# Patient Record
Sex: Male | Born: 1995 | Race: White | Hispanic: No | Marital: Single | Smoking: Never smoker
Health system: Southern US, Community
[De-identification: ages and names within clinical notes are randomized; demographics above are authoritative.]

## PROBLEM LIST (undated history)

## (undated) DIAGNOSIS — T7840XA Allergy, unspecified, initial encounter: Secondary | ICD-10-CM

## (undated) DIAGNOSIS — J45909 Unspecified asthma, uncomplicated: Secondary | ICD-10-CM

## (undated) HISTORY — PX: CLEFT PALATE REPAIR: SUR1165

## (undated) HISTORY — DX: Allergy, unspecified, initial encounter: T78.40XA

## (undated) HISTORY — PX: CYST EXCISION: SHX5701

---
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
## 2013-11-06 ENCOUNTER — Encounter: Payer: Self-pay | Admitting: Family

## 2013-11-06 ENCOUNTER — Encounter (INDEPENDENT_AMBULATORY_CARE_PROVIDER_SITE_OTHER): Payer: Self-pay

## 2013-11-06 ENCOUNTER — Ambulatory Visit (INDEPENDENT_AMBULATORY_CARE_PROVIDER_SITE_OTHER): Payer: BC Managed Care – PPO | Admitting: Family

## 2013-11-06 VITALS — BP 126/90 | HR 87 | Ht 70.0 in | Wt 234.0 lb

## 2013-11-06 DIAGNOSIS — J309 Allergic rhinitis, unspecified: Secondary | ICD-10-CM

## 2013-11-06 DIAGNOSIS — J4530 Mild persistent asthma, uncomplicated: Secondary | ICD-10-CM | POA: Insufficient documentation

## 2013-11-06 DIAGNOSIS — J45909 Unspecified asthma, uncomplicated: Secondary | ICD-10-CM

## 2013-11-06 DIAGNOSIS — F988 Other specified behavioral and emotional disorders with onset usually occurring in childhood and adolescence: Secondary | ICD-10-CM | POA: Insufficient documentation

## 2013-11-06 DIAGNOSIS — J4540 Moderate persistent asthma, uncomplicated: Secondary | ICD-10-CM | POA: Insufficient documentation

## 2013-11-06 MED ORDER — FLUTICASONE PROPIONATE HFA 110 MCG/ACT IN AERO: 1.0000 | INHALATION_SPRAY | Freq: Two times a day (BID) | RESPIRATORY_TRACT | Status: DC

## 2013-11-06 MED ORDER — ALBUTEROL SULFATE HFA 108 (90 BASE) MCG/ACT IN AERS: 2.0000 | INHALATION_SPRAY | Freq: Four times a day (QID) | RESPIRATORY_TRACT | Status: DC | PRN

## 2013-11-06 MED ORDER — AMPHETAMINE-DEXTROAMPHET ER 20 MG PO CP24: 20.0000 mg | ORAL_CAPSULE | ORAL | Status: DC

## 2013-11-06 MED ORDER — FEXOFENADINE HCL 180 MG PO TABS: 180.0000 mg | ORAL_TABLET | Freq: Every day | ORAL | Status: DC

## 2013-11-06 NOTE — Patient Instructions (Signed)

## 2013-11-06 NOTE — Progress Notes (Signed)
Pre visit review using our clinic review tool, if applicable. No additional management support is needed unless otherwise documented below in the visit note. 

## 2013-11-06 NOTE — Progress Notes (Signed)
   Subjective:    Patient ID: Eric Gould, male    DOB: 1996/02/01, 17 y.o.   MRN: [id number]  HPI 17 year old white male, college student at [employer], is in to be established. He has a history of asthma, allergic rhinitis, and attention deficit disorder. He currently takes Proventil, Psychologist, forensic. In the past, he's been on Adderall for attention deficit disorder. He has currently been off the medication approximately one year. Patient reports that he discontinue the medication because it was causing decreased appetite and he needed to gain weight for wrestling. He is no longer wrestling this found a 7 difficulty concentrating and college. He drop one class this semester. He has a long-standing history of attention deficit disorder dating back to his childhood.   Review of Systems  Constitutional: Negative.   HENT: Negative.   Respiratory: Negative.   Cardiovascular: Negative.   Gastrointestinal: Negative.   Endocrine: Negative.   Genitourinary: Negative.   Musculoskeletal: Negative.   Skin: Negative.   Neurological: Negative.   Hematological: Negative.   Psychiatric/Behavioral: Negative.    Past Medical History  Diagnosis Date  . Asthma     History   Social History  . Marital Status: Single    Spouse Name: N/A    Number of Children: N/A  . Years of Education: N/A   Occupational History  . Not on file.   Social History Main Topics  . Smoking status: Never Smoker   . Smokeless tobacco: Not on file  . Alcohol Use: No  . Drug Use: No  . Sexual Activity:    Other Topics Concern  . Not on file   Social History Narrative  . No narrative on file    Past Surgical History  Procedure Laterality Date  . Cleft palate repair      Family History  Problem Relation Age of Onset  . Asthma Mother     No Known Allergies  No current outpatient prescriptions on file prior to visit.   No current facility-administered medications on file prior to visit.    BP  126/90  Pulse 87  Ht 5\' 10"  (1.778 m)  Wt 234 lb (106.142 kg)  BMI 33.58 kg/m2chart    Objective:   Physical Exam  Constitutional: He is oriented to person, place, and time. He appears well-developed and well-nourished.  HENT:  Right Ear: External ear normal.  Left Ear: External ear normal.  Nose: Nose normal.  Mouth/Throat: Oropharynx is clear and moist.  Neck: Normal range of motion. Neck supple.  Cardiovascular: Normal rate, regular rhythm and normal heart sounds.   Pulmonary/Chest: Effort normal and breath sounds normal.  Musculoskeletal: Normal range of motion.  Neurological: He is alert and oriented to person, place, and time.  Skin: Skin is warm and dry.  Psychiatric: He has a normal mood and affect.          Assessment & Plan:  Assessment: 1. Allergic rhinitis 2. Asthma 3. Attention deficit disorder  Plan: Continue current medications. Recheck in 3 weeks on Adderall XR 20 mg once daily. Probably office with any questions or concerns.

## 2013-11-27 ENCOUNTER — Ambulatory Visit: Payer: BC Managed Care – PPO | Admitting: Family

## 2013-11-29 ENCOUNTER — Encounter: Payer: Self-pay | Admitting: Family

## 2013-11-29 ENCOUNTER — Ambulatory Visit (INDEPENDENT_AMBULATORY_CARE_PROVIDER_SITE_OTHER): Payer: BC Managed Care – PPO | Admitting: Family

## 2013-11-29 VITALS — BP 120/80 | HR 81 | Wt 232.0 lb

## 2013-11-29 DIAGNOSIS — F988 Other specified behavioral and emotional disorders with onset usually occurring in childhood and adolescence: Secondary | ICD-10-CM

## 2013-11-29 MED ORDER — AMPHETAMINE-DEXTROAMPHET ER 20 MG PO CP24: 20.0000 mg | ORAL_CAPSULE | ORAL | Status: DC

## 2013-11-29 MED ORDER — BECLOMETHASONE DIPROPIONATE 40 MCG/ACT IN AERS: 2.0000 | INHALATION_SPRAY | Freq: Two times a day (BID) | RESPIRATORY_TRACT | Status: DC

## 2013-11-29 NOTE — Progress Notes (Signed)
   Subjective:    Patient ID: Eric Gould, male    DOB: 05/15/1996, 18 y.o.   MRN: [id number]  HPI 18 year old white male, nonsmoker is in for recheck of attention deficit disorder. He started the Adderall 20 mg once daily and is tolerating the medication well. Reports sleeping well. Appetite is decreased around lunchtime but he still manages the healthy diet. Concentration has improved significantly.   Review of Systems  Constitutional: Negative.   Respiratory: Negative.   Cardiovascular: Negative.   Gastrointestinal: Negative.   Neurological: Negative.   Psychiatric/Behavioral: Negative.    Past Medical History  Diagnosis Date  . Asthma     History   Social History  . Marital Status: Single    Spouse Name: N/A    Number of Children: N/A  . Years of Education: N/A   Occupational History  . Not on file.   Social History Main Topics  . Smoking status: Never Smoker   . Smokeless tobacco: Not on file  . Alcohol Use: No  . Drug Use: No  . Sexual Activity:    Other Topics Concern  . Not on file   Social History Narrative  . No narrative on file    Past Surgical History  Procedure Laterality Date  . Cleft palate repair      Family History  Problem Relation Age of Onset  . Asthma Mother     No Known Allergies  Current Outpatient Prescriptions on File Prior to Visit  Medication Sig Dispense Refill  . albuterol (PROVENTIL HFA;VENTOLIN HFA) 108 (90 BASE) MCG/ACT inhaler Inhale 2 puffs into the lungs every 6 (six) hours as needed. For shortness of breath  3.7 Inhaler  3  . fexofenadine (ALLEGRA) 180 MG tablet Take 1 tablet (180 mg total) by mouth daily.  90 tablet  1  . ofloxacin (OCUFLOX) 0.3 % ophthalmic solution Place 1 drop into both eyes as needed.        No current facility-administered medications on file prior to visit.    BP 120/80  Pulse 81  Wt 232 lb (105.235 kg)chart    Objective:   Physical Exam  Constitutional: He appears well-developed and  well-nourished.  Neck: Normal range of motion. Neck supple.  Cardiovascular: Normal rate, regular rhythm and normal heart sounds.   Pulmonary/Chest: Effort normal and breath sounds normal.  Musculoskeletal: Normal range of motion.  Neurological: He is alert.  Skin: Skin is warm.  Psychiatric: He has a normal mood and affect.          Assessment & Plan:  Assessment: 1 Attention deficit disorder  Plan: Urine drug screen sent today. Prescription for Adderall given x3 months. Recheck in 3 months and sooner as needed..Marland Kitchen

## 2013-11-29 NOTE — Patient Instructions (Signed)
Attention Deficit Hyperactivity Disorder Attention deficit hyperactivity disorder (ADHD) is a problem with behavior issues based on the way the brain functions (neurobehavioral disorder). It is a common reason for behavior and academic problems in school. CAUSES  The cause of ADHD is unknown in most cases. It may run in families. It sometimes can be associated with learning disabilities and other behavioral problems. SYMPTOMS  There are 3 types of ADHD. The 3 types and some of the symptoms include:  Inattentive  Gets bored or distracted easily.  Loses or forgets things. Forgets to hand in homework.  Has trouble organizing or completing tasks.  Difficulty staying on task.  An inability to organize daily tasks and school work.  Leaving projects, chores, or homework unfinished.  Trouble paying attention or responding to details. Careless mistakes.  Difficulty following directions. Often seems like is not listening.  Dislikes activities that require sustained attention (like chores or homework).  Hyperactive-impulsive  Feels like it is impossible to sit still or stay in a seat. Fidgeting with hands and feet.  Trouble waiting turn.  Talking too much or out of turn. Interruptive.  Speaks or acts impulsively.  Aggressive, disruptive behavior.  Constantly busy or on the go, noisy.  Combined  Has symptoms of both of the above. Often children with ADHD feel discouraged about themselves and with school. They often perform well below their abilities in school. These symptoms can cause problems in home, school, and in relationships with peers. As children get older, the excess motor activities can calm down, but the problems with paying attention and staying organized persist. Most children do not outgrow ADHD but with good treatment can learn to cope with the symptoms. DIAGNOSIS  When ADHD is suspected, the diagnosis should be made by professionals trained in ADHD.  Diagnosis will  include:  Ruling out other reasons for the child's behavior.  The caregivers will check with the child's school and check their medical records.  They will talk to teachers and parents.  Behavior rating scales for the child will be filled out by those dealing with the child on a daily basis. A diagnosis is made only after all information has been considered. TREATMENT  Treatment usually includes behavioral treatment often along with medicines. It may include stimulant medicines. The stimulant medicines decrease impulsivity and hyperactivity and increase attention. Other medicines used include antidepressants and certain blood pressure medicines. Most experts agree that treatment for ADHD should address all aspects of the child's functioning. Treatment should not be limited to the use of medicines alone. Treatment should include structured classroom management. The parents must receive education to address rewarding good behavior, discipline, and limit-setting. Tutoring or behavioral therapy or both should be available for the child. If untreated, the disorder can have long-term serious effects into adolescence and adulthood. HOME CARE INSTRUCTIONS   Often with ADHD there is a lot of frustration among the family in dealing with the illness. There is often blame and anger that is not warranted. This is a life long illness. There is no way to prevent ADHD. In many cases, because the problem affects the family as a whole, the entire family may need help. A therapist can help the family find better ways to handle the disruptive behaviors and promote change. If the child is young, most of the therapist's work is with the parents. Parents will learn techniques for coping with and improving their child's behavior. Sometimes only the child with the ADHD needs counseling. Your caregivers can help   you make these decisions.  Children with ADHD may need help in organizing. Some helpful tips include:  Keep  routines the same every day from wake-up time to bedtime. Schedule everything. This includes homework and playtime. This should include outdoor and indoor recreation. Keep the schedule on the refrigerator or a bulletin board where it is frequently seen. Mark schedule changes as far in advance as possible.  Have a place for everything and keep everything in its place. This includes clothing, backpacks, and school supplies.  Encourage writing down assignments and bringing home needed books.  Offer your child a well-balanced diet. Breakfast is especially important for school performance. Children should avoid drinks with caffeine including:  Soft drinks.  Coffee.  Tea.  However, some older children (adolescents) may find these drinks helpful in improving their attention.  Children with ADHD need consistent rules that they can understand and follow. If rules are followed, give small rewards. Children with ADHD often receive, and expect, criticism. Look for good behavior and praise it. Set realistic goals. Give clear instructions. Look for activities that can foster success and self-esteem. Make time for pleasant activities with your child. Give lots of affection.  Parents are their children's greatest advocates. Learn as much as possible about ADHD. This helps you become a stronger and better advocate for your child. It also helps you educate your child's teachers and instructors if they feel inadequate in these areas. Parent support groups are often helpful. A national group with local chapters is called CHADD (Children and Adults with Attention Deficit Hyperactivity Disorder). PROGNOSIS  There is no cure for ADHD. Children with the disorder seldom outgrow it. Many find adaptive ways to accommodate the ADHD as they mature. SEEK MEDICAL CARE IF:  Your child has repeated muscle twitches, cough or speech outbursts.  Your child has sleep problems.  Your child has a marked loss of  appetite.  Your child develops depression.  Your child has new or worsening behavioral problems.  Your child develops dizziness.  Your child has a racing heart.  Your child has stomach pains.  Your child develops headaches. Document Released: 10/29/2002 Document Revised: 01/31/2012 Document Reviewed: 05/30/2013 ExitCare Patient Information 2014 [employer].  

## 2013-12-26 ENCOUNTER — Encounter: Payer: Self-pay | Admitting: Family

## 2014-05-14 ENCOUNTER — Ambulatory Visit: Payer: BC Managed Care – PPO | Admitting: Family

## 2014-06-24 ENCOUNTER — Telehealth: Payer: Self-pay | Admitting: Family

## 2014-06-24 NOTE — Telephone Encounter (Signed)
Pt needs a re-fills on the following: amphetamine-dextroamphetamine (ADDERALL XR) 20 MG 24 hr capsule albuterol (PROVENTIL HFA;VENTOLIN HFA) 108 (90 BASE) MCG/ACT inhaler beclomethasone (QVAR) 40 MCG/ACT inhaler fexofenadine (ALLEGRA) 180 MG tablet  [id number] - [place], [place] - [address]

## 2014-06-24 NOTE — Telephone Encounter (Signed)
Pt needs follow up

## 2014-06-24 NOTE — Telephone Encounter (Signed)
Pt is scheduled for 06/26/14

## 2014-06-26 ENCOUNTER — Encounter: Payer: Self-pay | Admitting: Family

## 2014-06-26 ENCOUNTER — Ambulatory Visit (INDEPENDENT_AMBULATORY_CARE_PROVIDER_SITE_OTHER): Payer: BC Managed Care – PPO | Admitting: Family

## 2014-06-26 VITALS — BP 120/80 | HR 89 | Wt 230.0 lb

## 2014-06-26 DIAGNOSIS — J309 Allergic rhinitis, unspecified: Secondary | ICD-10-CM

## 2014-06-26 DIAGNOSIS — J4520 Mild intermittent asthma, uncomplicated: Secondary | ICD-10-CM

## 2014-06-26 DIAGNOSIS — F988 Other specified behavioral and emotional disorders with onset usually occurring in childhood and adolescence: Secondary | ICD-10-CM

## 2014-06-26 DIAGNOSIS — J45909 Unspecified asthma, uncomplicated: Secondary | ICD-10-CM

## 2014-06-26 MED ORDER — BECLOMETHASONE DIPROPIONATE 40 MCG/ACT IN AERS: 2.0000 | INHALATION_SPRAY | Freq: Two times a day (BID) | RESPIRATORY_TRACT | Status: DC

## 2014-06-26 MED ORDER — AMPHETAMINE-DEXTROAMPHET ER 20 MG PO CP24: 20.0000 mg | ORAL_CAPSULE | ORAL | Status: DC

## 2014-06-26 MED ORDER — ALBUTEROL SULFATE HFA 108 (90 BASE) MCG/ACT IN AERS: 2.0000 | INHALATION_SPRAY | Freq: Four times a day (QID) | RESPIRATORY_TRACT | Status: DC | PRN

## 2014-06-26 MED ORDER — FEXOFENADINE HCL 180 MG PO TABS: 180.0000 mg | ORAL_TABLET | Freq: Every day | ORAL | Status: DC

## 2014-06-26 NOTE — Progress Notes (Signed)
Subjective:    Patient ID: Eric Gould, male    DOB: 10-31-1996, 18 y.o.   MRN: [id number]  HPI  18 year old male, nonsmoker, college student at [employer] he is in today for refill of Adderall for attention deficit disorder. Is currently stable on medications without any concerns. Also has intrinsic asthma. The refill on Proventil. Has allergic rhinitis and takes Allegra.  Review of Systems  Constitutional: Negative.   HENT: Negative.   Respiratory: Negative.  Negative for cough and wheezing.   Cardiovascular: Negative.   Gastrointestinal: Negative.   Genitourinary: Negative.   Musculoskeletal: Negative.   Skin: Negative.   Allergic/Immunologic: Negative.   Neurological: Negative.   Hematological: Negative.   Psychiatric/Behavioral: Negative.      Past Medical History  Diagnosis Date  . Asthma     History   Social History  . Marital Status: Single    Spouse Name: N/A    Number of Children: N/A  . Years of Education: N/A   Occupational History  . Not on file.   Social History Main Topics  . Smoking status: Never Smoker   . Smokeless tobacco: Not on file  . Alcohol Use: No  . Drug Use: No  . Sexual Activity:    Other Topics Concern  . Not on file   Social History Narrative  . No narrative on file    Past Surgical History  Procedure Laterality Date  . Cleft palate repair      Family History  Problem Relation Age of Onset  . Asthma Mother     No Known Allergies  Current Outpatient Prescriptions on File Prior to Visit  Medication Sig Dispense Refill  . ofloxacin (OCUFLOX) 0.3 % ophthalmic solution Place 1 drop into both eyes as needed.        No current facility-administered medications on file prior to visit.    BP 120/80  Pulse 89  Wt 230 lb (104.327 kg)chart    Objective:   Physical Exam  Constitutional: He is oriented to person, place, and time. He appears well-developed and well-nourished.  HENT:  Right Ear:  External ear normal.  Left Ear: External ear normal.  Nose: Nose normal.  Mouth/Throat: Oropharynx is clear and moist.  Neck: Normal range of motion. Neck supple.  Cardiovascular: Normal rate, regular rhythm and normal heart sounds.   Pulmonary/Chest: Effort normal.  Musculoskeletal: Normal range of motion.  Neurological: He is alert and oriented to person, place, and time.  Skin: Skin is warm and dry.  Psychiatric: He has a normal mood and affect.          Assessment & Plan:  Raeford was seen today for medication refill.  Diagnoses and associated orders for this visit:  ADD (attention deficit disorder)  Allergic rhinitis, unspecified allergic rhinitis type  Intrinsic asthma, mild intermittent, uncomplicated  Other Orders - fexofenadine (ALLEGRA) 180 MG tablet; Take 1 tablet (180 mg total) by mouth daily. - beclomethasone (QVAR) 40 MCG/ACT inhaler; Inhale 2 puffs into the lungs 2 (two) times daily. - amphetamine-dextroamphetamine (ADDERALL XR) 20 MG 24 hr capsule; Take 1 capsule (20 mg total) by mouth every morning. - amphetamine-dextroamphetamine (ADDERALL XR) 20 MG 24 hr capsule; Take 1 capsule (20 mg total) by mouth every morning. - albuterol (PROVENTIL HFA;VENTOLIN HFA) 108 (90 BASE) MCG/ACT inhaler; Inhale 2 puffs into the lungs every 6 (six) hours as needed. For shortness of breath - amphetamine-dextroamphetamine (ADDERALL XR) 20 MG 24 hr capsule; Take 1 capsule (20 mg  total) by mouth every morning.    call the office if any questions or concerns. Recheck as scheduled in 3 months and sooner as needed.

## 2014-06-26 NOTE — Patient Instructions (Signed)
Exercise to Stay Healthy Exercise helps you become and stay healthy. EXERCISE IDEAS AND TIPS Choose exercises that:  You enjoy.  Fit into your day. You do not need to exercise really hard to be healthy. You can do exercises at a slow or medium level and stay healthy. You can:  Stretch before and after working out.  Try yoga, Pilates, or tai chi.  Lift weights.  Walk fast, swim, jog, run, climb stairs, bicycle, dance, or rollerskate.  Take aerobic classes. Exercises that burn about 150 calories:  Running 1  miles in 15 minutes.  Playing volleyball for 45 to 60 minutes.  Washing and waxing a car for 45 to 60 minutes.  Playing touch football for 45 minutes.  Walking 1  miles in 35 minutes.  Pushing a stroller 1  miles in 30 minutes.  Playing basketball for 30 minutes.  Raking leaves for 30 minutes.  Bicycling 5 miles in 30 minutes.  Walking 2 miles in 30 minutes.  Dancing for 30 minutes.  Shoveling snow for 15 minutes.  Swimming laps for 20 minutes.  Walking up stairs for 15 minutes.  Bicycling 4 miles in 15 minutes.  Gardening for 30 to 45 minutes.  Jumping rope for 15 minutes.  Washing windows or floors for 45 to 60 minutes. Document Released: 12/11/2010 Document Revised: 01/31/2012 Document Reviewed: 12/11/2010 ExitCare Patient Information 2015 [employer]. This information is not intended to replace advice given to you by your health care provider. Make sure you discuss any questions you have with your health care provider.  

## 2014-09-03 ENCOUNTER — Telehealth: Payer: Self-pay | Admitting: Family

## 2014-09-03 NOTE — Telephone Encounter (Signed)
Mom called to ask for a letter stating that pt has asthma. They need this letter so that pt can get a handicap pass at school.

## 2014-09-04 ENCOUNTER — Telehealth: Payer: Self-pay | Admitting: Family

## 2014-09-04 MED ORDER — ALBUTEROL SULFATE (2.5 MG/3ML) 0.083% IN NEBU: 2.5000 mg | INHALATION_SOLUTION | Freq: Four times a day (QID) | RESPIRATORY_TRACT | Status: DC | PRN

## 2014-09-04 NOTE — Telephone Encounter (Signed)
Pt mom is aware note ready

## 2014-09-04 NOTE — Telephone Encounter (Signed)
Ok to give a note.

## 2014-09-04 NOTE — Telephone Encounter (Signed)
[id number] - [place], [place] - [address] is requesting re-fill on ALBUTEROL 0.083% NEB SOLUTION. *comment states RX was originally written by pediatrician*

## 2014-09-04 NOTE — Telephone Encounter (Signed)
Please advise 

## 2014-09-04 NOTE — Telephone Encounter (Signed)
Note ready for pick up 

## 2014-09-04 NOTE — Telephone Encounter (Signed)
Done

## 2014-09-05 ENCOUNTER — Ambulatory Visit: Payer: BC Managed Care – PPO | Admitting: Internal Medicine

## 2014-09-10 ENCOUNTER — Telehealth: Payer: Self-pay | Admitting: Family

## 2014-09-10 NOTE — Telephone Encounter (Signed)
Mom would like a cb today in regards to the handicap sticker.  Mom would like padonda to call. They do not have a handicap sticker just for the school campus. Thanks.

## 2014-09-10 NOTE — Telephone Encounter (Signed)
Pt's mom is upset with Padonda's decision not sign DMV handicap placard. She states that she doesn't understand why it won't be signed considering that a note was sent to her stating that he needs a handicap pass. I advised pt's mom that the note sent to me stated that pt needs note for a parking pass for school not a handicap placard [facility].   Pt's mom is requesting to speak to [place] and Padonda agreed.

## 2014-12-21 ENCOUNTER — Other Ambulatory Visit: Payer: Self-pay | Admitting: Family

## 2015-02-18 ENCOUNTER — Other Ambulatory Visit: Payer: Self-pay | Admitting: Family

## 2015-03-05 ENCOUNTER — Telehealth: Payer: Self-pay | Admitting: Family

## 2015-03-05 NOTE — Telephone Encounter (Signed)
padonda had written pt a rx for amphetamine-dextroamphetamine (ADDERALL XR) 20 MG 24 hr capsule  . Mom went to take rx to get filled, pt is away at college. RX is over 206 months old. Pharm would not fill. Mom states pt does not take every day. Mom would like to get this rx exchanged for a new one so that pt can get through exams.

## 2015-03-05 NOTE — Telephone Encounter (Signed)
Denied.  Pt needs appointment. 

## 2015-03-06 ENCOUNTER — Other Ambulatory Visit: Payer: Self-pay

## 2015-03-06 MED ORDER — AMPHETAMINE-DEXTROAMPHET ER 20 MG PO CP24: 20.0000 mg | ORAL_CAPSULE | ORAL | Status: DC

## 2015-03-06 MED ORDER — AMPHETAMINE-DEXTROAMPHET ER 20 MG PO CP24
ORAL_CAPSULE | ORAL | Status: DC
Start: 2015-03-06 — End: 2015-04-07

## 2015-03-06 NOTE — Telephone Encounter (Signed)
You can give a 1x fill under circumstances if we can verify with pharmacy. Further refills would need an appointment.

## 2015-03-06 NOTE — Telephone Encounter (Signed)
Refilled for 1 month, notify pt mom that Rx will be upfront.

## 2015-03-06 NOTE — Telephone Encounter (Signed)
Pt needs rx for his adderall Pam Grizzle/(mom) states the amphetamine-dextroamphetamine (ADDERALL XR) 20 MG 24 hr capsule rx is not a new rx, is has expired b/c pt only takes as needed. Mom took took to pharm and rx was 86 mos old and but pharm would not fill because of expiration. Pt is having exams now and mom was going to drive the rx to [place].  Mom has the old rx, would like to exchange for an updated. Pt will be home form school Friday,  May 13, and mom says she can bring pt in that day if you want. Pt has appt 6/24 to est w/ you but will absolutley bring pt in sooner, whenever you want. But [place] is in [place], needs his rx for exams and hoping you can help.

## 2015-03-06 NOTE — Telephone Encounter (Signed)
Mom will bring me old rx and I will bring to you when she arrives. Thanks!

## 2015-03-06 NOTE — Telephone Encounter (Signed)
See below

## 2015-04-07 ENCOUNTER — Encounter: Payer: Self-pay | Admitting: Family Medicine

## 2015-04-07 ENCOUNTER — Ambulatory Visit (INDEPENDENT_AMBULATORY_CARE_PROVIDER_SITE_OTHER): Payer: BLUE CROSS/BLUE SHIELD | Admitting: Family Medicine

## 2015-04-07 VITALS — BP 110/70 | HR 85 | Temp 98.1°F | Wt 226.0 lb

## 2015-04-07 DIAGNOSIS — J4540 Moderate persistent asthma, uncomplicated: Secondary | ICD-10-CM

## 2015-04-07 DIAGNOSIS — F909 Attention-deficit hyperactivity disorder, unspecified type: Secondary | ICD-10-CM | POA: Diagnosis not present

## 2015-04-07 DIAGNOSIS — F988 Other specified behavioral and emotional disorders with onset usually occurring in childhood and adolescence: Secondary | ICD-10-CM

## 2015-04-07 MED ORDER — AMPHETAMINE-DEXTROAMPHET ER 20 MG PO CP24: 20.0000 mg | ORAL_CAPSULE | ORAL | Status: DC

## 2015-04-07 NOTE — Patient Instructions (Signed)
Things look great, see you back in June for your physical. Hope you figure out where you want to go in life.

## 2015-04-07 NOTE — Progress Notes (Signed)
Tana ConchStephen Zael Shuman, MD  Subjective:  Eric Gould is a 19 y.o. year old very pleasant male patient who presents with:  See problem oriented charting- ROS- no palpitations, chest pain, blood pressure issues. Occasional shortness of breath and wheeze relieved by albuterol  Past Medical History- allergic rhinitis  Medications- reviewed and updated Current Outpatient Prescriptions  Medication Sig Dispense Refill  . amphetamine-dextroamphetamine (ADDERALL XR) 20 MG 24 hr capsule Take 1 capsule (20 mg total) by mouth every morning. 30 capsule 0  . beclomethasone (QVAR) 40 MCG/ACT inhaler Inhale 2 puffs into the lungs 2 (two) times daily. 1 Inhaler 3  . fexofenadine (ALLEGRA) 180 MG tablet TAKE 1 TABLET BY MOUTH EVERY DAY 30 tablet 0  . albuterol (PROVENTIL) (2.5 MG/3ML) 0.083% nebulizer solution Take 3 mLs (2.5 mg total) by nebulization every 6 (six) hours as needed for wheezing or shortness of breath. (Patient not taking: Reported on 04/07/2015) 75 mL 3  . PROAIR HFA 108 (90 BASE) MCG/ACT inhaler INHALE 2 PUFFS INTO THE LUNGS EVERY 6 (SIX) HOURS AS NEEDED. FOR SHORTNESS OF BREATH (Patient not taking: Reported on 04/07/2015) 8.5 Inhaler 1   Objective: BP 110/70 mmHg  Pulse 85  Temp(Src) 98.1 F (36.7 C)  Wt 226 lb (102.513 kg) Gen: NAD, resting comfortably, muscular  CV: RRR no murmurs rubs or gallops Lungs: CTAB no crackles, wheeze, rhonchi Abdomen: soft/nontender/nondistended/normal bowel sounds.  Ext: no edema Skin: warm, dry, no rash  Neuro: grossly normal, moves all extremities   Assessment/Plan:  ADD (attention deficit disorder) S: Diagnosed 2nd or 3rd grade. Diagnosed by [name] of pediatrics. Started medication in 6th grade. Had weight gain issues (couldnt eat enough for sports) and stopped for a while in high school. Restarted when went to colleges. Took concerta in the past- no clear issues with this but has tolerated adderall 20mg  XR well without side effects (previously on  higher dose). Sometimes doesn't take medication on weekends.  A/P: doing well, 3 months rx provided adderall XR 20mg  with call in 3 months, visit in 6 months.     Asthma, moderate persistent, well-controlled S: controlled with Qvar twice a day,albuterol once every 2 weeks.  A/P: controlled, continue current rx    Return for CPE before end of year   Meds ordered this encounter  Medications  .  amphetamine-dextroamphetamine (ADDERALL XR) 20 MG 24 hr capsule    Sig: Take 1 capsule (20 mg total) by mouth every morning.    Dispense:  30 capsule    Refill:  0    May fill 04/07/15  . amphetamine-dextroamphetamine (ADDERALL XR) 20 MG 24 hr capsule    Sig: Take 1 capsule (20 mg total) by mouth every morning.    Dispense:  30 capsule    Refill:  0    May fill 05/08/15  . amphetamine-dextroamphetamine (ADDERALL XR) 20 MG 24 hr capsule    Sig: Take 1 capsule (20 mg total) by mouth every morning.    Dispense:  30 capsule    Refill:  0    May fill 06/07/15

## 2015-04-07 NOTE — Assessment & Plan Note (Signed)
S: Diagnosed 2nd or 3rd grade. Diagnosed by [name] of pediatrics. Started medication in 6th grade. Had weight gain issues (couldnt eat enough for sports) and stopped for a while in high school. Restarted when went to colleges. Took concerta in the past- no clear issues with this but has tolerated adderall 20mg  XR well without side effects (previously on higher dose). Sometimes doesn't take medication on weekends.  A/P: doing well, 3 months rx provided adderall XR 20mg  with call in 3 months, visit in 6 months.

## 2015-04-07 NOTE — Assessment & Plan Note (Signed)
S: controlled with Qvar twice a day,albuterol once every 2 weeks.  A/P: controlled, continue current rx

## 2015-05-16 ENCOUNTER — Ambulatory Visit: Payer: Self-pay | Admitting: Family Medicine

## 2015-05-29 ENCOUNTER — Encounter: Payer: Self-pay | Admitting: Family Medicine

## 2015-05-29 ENCOUNTER — Ambulatory Visit (INDEPENDENT_AMBULATORY_CARE_PROVIDER_SITE_OTHER): Payer: BLUE CROSS/BLUE SHIELD | Admitting: Family Medicine

## 2015-05-29 VITALS — BP 116/84 | HR 76 | Temp 98.6°F | Wt 230.0 lb

## 2015-05-29 DIAGNOSIS — F909 Attention-deficit hyperactivity disorder, unspecified type: Secondary | ICD-10-CM

## 2015-05-29 DIAGNOSIS — J4540 Moderate persistent asthma, uncomplicated: Secondary | ICD-10-CM | POA: Diagnosis not present

## 2015-05-29 DIAGNOSIS — Z23 Encounter for immunization: Secondary | ICD-10-CM

## 2015-05-29 DIAGNOSIS — F988 Other specified behavioral and emotional disorders with onset usually occurring in childhood and adolescence: Secondary | ICD-10-CM

## 2015-05-29 DIAGNOSIS — Z0000 Encounter for general adult medical examination without abnormal findings: Secondary | ICD-10-CM

## 2015-05-29 MED ORDER — AMPHETAMINE-DEXTROAMPHET ER 20 MG PO CP24: 20.0000 mg | ORAL_CAPSULE | ORAL | Status: DC

## 2015-05-29 NOTE — Progress Notes (Signed)
Eric ConchStephen Dresden Lozito, MD Phone: [phone]  Subjective:  Patient presents today for their annual physical and former patient of Dr.Campbell/Webb, now estbalishing with me. Chief complaint-noted.   Sexually active only with gf. Uses condoms and she uses birth control. Has been tested while at college- and no new partners and partner tested as well. Declines testing today  Lifts and runs 3-4 days a week and work very active. Works out everyday at school. Some excess abdominal fat but overall muscular.   ROS- full  review of systems was completed and negative  pertinent ROS- no chest pain or shortness of breath (except when asthma flaring), no palpitations or unintentional weight loss  The following were reviewed and entered/updated in epic: Past Medical History  Diagnosis Date  . Asthma    Patient Active Problem List   Diagnosis Date Noted  . ADD (attention deficit disorder) 11/06/2013    Priority: High  . Asthma, moderate persistent, well-controlled 11/06/2013    Priority: Medium  . Allergic rhinitis 11/06/2013    Priority: Low   Past Surgical History  Procedure Laterality Date  . Cleft palate repair    . Cyst excision      left face- benign    Family History  Problem Relation Age of Onset  . Asthma Mother   . Other Father     passed from hunting accident    Medications- reviewed and updated Current Outpatient Prescriptions  Medication Sig Dispense Refill  . amphetamine-dextroamphetamine (ADDERALL XR) 20 MG 24 hr capsule Take 1 capsule (20 mg total) by mouth every morning. 30 capsule 0  . beclomethasone (QVAR) 40 MCG/ACT inhaler Inhale 2 puffs into the lungs 2 (two) times daily. 1 Inhaler 3  . fexofenadine (ALLEGRA) 180 MG tablet TAKE 1 TABLET BY MOUTH EVERY DAY 30 tablet 0  . albuterol (PROVENTIL) (2.5 MG/3ML) 0.083% nebulizer solution Take 3 mLs (2.5 mg total) by nebulization every 6 (six) hours as needed for wheezing or shortness of breath. (Patient not taking: Reported  on 04/07/2015) 75 mL 3  . PROAIR HFA 108 (90 BASE) MCG/ACT inhaler INHALE 2 PUFFS INTO THE LUNGS EVERY 6 (SIX) HOURS AS NEEDED. FOR SHORTNESS OF BREATH (Patient not taking: Reported on 05/29/2015) 8.5 Inhaler 1   Allergies-reviewed and updated No Known Allergies  History   Social History  . Marital Status: Single    Spouse Name: N/A  . Number of Children: N/A  . Years of Education: N/A   Social History Main Topics  . Smoking status: Never Smoker   . Smokeless tobacco: Not on file  . Alcohol Use: 0.0 oz/week    0 Standard drinks or equivalent per week     Comment: rare social  . Drug Use: No  . Sexual Activity: Not on file   Other Topics Concern  . None   Social History Narrative   Single- dating 2.5 years      [employer] [place] [employer]   [place] work in [place]   Majoring in Surveyor, mineralscriminal justice- not sure on plans, minor in Financial controllerenvironmental science      Hobbies: hunting, fishing, shooting    ROS--See HPI   Objective: BP 116/84 mmHg  Pulse 76  Temp(Src) 98.6 F (37 C)  Wt 230 lb (104.327 kg) Gen: NAD, resting comfortably HEENT: Mucous membranes are moist. Oropharynx normal Neck: no thyromegaly CV: RRR no murmurs rubs or gallops Lungs: CTAB no crackles, wheeze, rhonchi Abdomen: soft/nontender/nondistended/normal bowel sounds. No rebound or guarding.  Ext: no edema Skin:  warm, dry Neuro: grossly normal, moves all extremities, PERRLA   Assessment/Plan:  19 y.o. male presenting for annual physical.  Health Maintenance counseling: 1. Anticipatory guidance: Patient counseled regarding regular dental exams, wearing seatbelts, wearing sunscreen 2. Risk factor reduction:  Advised patient of need for regular exercise and diet rich and fruits and vegetables to reduce risk of heart attack and stroke.  3. Immunizations/screenings/ancillary studies Health Maintenance Due  Topic Date Due  . TETANUS/TDAP - today 03/27/2013  4. Advised monthly self  testicular exams 5. Declines std testing  ADD (attention deficit disorder) S: controlled A/P: refilled adderall x 2 more months- call in 3 months, return in 6 months   Asthma, moderate persistent, well-controlled S: controlled and using albuterol once every 2 weeks.  A/P: continue Qvar twice a day,albuterol prn    Return for future fasting labs Orders Placed This Encounter  Procedures  . Tdap vaccine greater than or equal to 7yo IM  . CBC    [place]    Standing Status: Future     Number of Occurrences:      Standing Expiration Date: 05/28/2016  . Comprehensive metabolic panel    [place]    Standing Status: Future     Number of Occurrences:      Standing Expiration Date: 05/28/2016    Order Specific Question:  Has the patient fasted?    Answer:  No  . Lipid panel    [place]    Standing Status: Future     Number of Occurrences:      Standing Expiration Date: 05/28/2016    Order Specific Question:  Has the patient fasted?    Answer:  No  . TSH    [place]    Standing Status: Future     Number of Occurrences:      Standing Expiration Date: 05/28/2016  . POCT urinalysis dipstick    In house    Standing Status: Future     Number of Occurrences:      Standing Expiration Date: 05/28/2016    Meds ordered this encounter  Medications  . DISCONTD: amphetamine-dextroamphetamine (ADDERALL XR) 20 MG 24 hr capsule    Sig: Take 1 capsule (20 mg total) by mouth every morning.    Dispense:  30 capsule    Refill:  0    May fill 07/08/15  . amphetamine-dextroamphetamine (ADDERALL XR) 20 MG 24 hr capsule    Sig: Take 1 capsule (20 mg total) by mouth every morning.    Dispense:  30 capsule    Refill:  0    May fill 08/08/15

## 2015-05-29 NOTE — Assessment & Plan Note (Signed)
S: controlled A/P: refilled adderall x 2 more months- call in 3 months, return in 6 months

## 2015-05-29 NOTE — Assessment & Plan Note (Signed)
S: controlled and using albuterol once every 2 weeks.  A/P: continue Qvar twice a day,albuterol prn

## 2015-05-29 NOTE — Patient Instructions (Addendum)
Tdap today. Repeat 10 years  Keep up the exercise. Try to avoid gaining any weight- exercise more or decrease portion size or sweets as needed. Tyr to stay in 215-225 range as long as maintaining current muscle mass  Consider reading If you can handout I gave you.   Schedule a lab visit at the front desk. Return for future fasting labs. Nothing but water after midnight please.   2 more months ADD medicines given. Call before you run out at least 2 weeks early  Advised monthly self testicular exams  See us in 6 months

## 2015-08-30 ENCOUNTER — Other Ambulatory Visit: Payer: Self-pay | Admitting: Family

## 2015-09-03 NOTE — Telephone Encounter (Signed)
Pt has been waiting on refill since 10/8 for refill  beclomethasone (QVAR) 40 MCG/ACT inhaler However, padonda had previolsly prescribed and it was not sent to dr Therapist, [facility]. [facility] [place]   Pt is out. Thank you

## 2015-10-08 ENCOUNTER — Other Ambulatory Visit: Payer: Self-pay | Admitting: Family Medicine

## 2015-11-03 ENCOUNTER — Encounter: Payer: Self-pay | Admitting: Adult Health

## 2015-11-03 ENCOUNTER — Ambulatory Visit (INDEPENDENT_AMBULATORY_CARE_PROVIDER_SITE_OTHER): Payer: BLUE CROSS/BLUE SHIELD | Admitting: Adult Health

## 2015-11-03 VITALS — BP 126/84 | Temp 98.1°F | Ht 70.0 in | Wt 238.0 lb

## 2015-11-03 DIAGNOSIS — J0100 Acute maxillary sinusitis, unspecified: Secondary | ICD-10-CM | POA: Diagnosis not present

## 2015-11-03 DIAGNOSIS — N50819 Testicular pain, unspecified: Secondary | ICD-10-CM | POA: Diagnosis not present

## 2015-11-03 MED ORDER — DOXYCYCLINE HYCLATE 100 MG PO CAPS: 100.0000 mg | ORAL_CAPSULE | Freq: Two times a day (BID) | ORAL | Status: DC

## 2015-11-03 NOTE — Progress Notes (Signed)
Pre visit review using our clinic review tool, if applicable. No additional management support is needed unless otherwise documented below in the visit note. 

## 2015-11-03 NOTE — Patient Instructions (Signed)
Follow up with any continued testicular pain  For the sinus infection  - Flonase and Sudafed for the next 3 days. If you do not see any improvement then use the Doxycycline.   For your sore throat - No signs of step, likely from the post nasal drip. You can use warm salt water gargles or a few tablespoons on honey throughout the day to help with the inflammation.

## 2015-11-03 NOTE — Progress Notes (Signed)
Subjective:    Patient ID: Eric Gould, male    DOB: 12/08/1995, 19 y.o.   MRN: [id number]  HPI  19 year old male who presents to the office today for testicular discomfort that he first noticed 2 weeks ago. Eric Gould endorses that approximately 2 weeks ago he started wearing boxer briefs stent of the boxers that he usually wears. During this time he was wearing boxer briefs he started to notice that he had testicular discomfort. He endorses that this was due to the boxer briefs being too tight. Currently he is back to wearing regular boxers and feels as though his testicular discomfort has minimized significantly. Denies any masses, swelling, discoloration, or severe pain in the testicles.  Also complaining of 3-4 days of sinus infection. Symptoms include headache sore throat and sinus drainage. Sick contacts include his mother and sister.  Review of Systems  Constitutional: Negative.   HENT: Positive for postnasal drip, rhinorrhea, sinus pressure and sore throat. Negative for trouble swallowing.   Eyes: Negative.   Respiratory: Negative for cough and shortness of breath.   Genitourinary: Positive for testicular pain. Negative for discharge, penile swelling, scrotal swelling, genital sores and penile pain.  Neurological: Negative.   Hematological: Negative.   All other systems reviewed and are negative.  Past Medical History  Diagnosis Date  . Asthma     Social History   Social History  . Marital Status: Single    Spouse Name: N/A  . Number of Children: N/A  . Years of Education: N/A   Occupational History  . Not on file.   Social History Main Topics  . Smoking status: Never Smoker   . Smokeless tobacco: Not on file  . Alcohol Use: 0.0 oz/week    0 Standard drinks or equivalent per week     Comment: rare social  . Drug Use: No  . Sexual Activity: Not on file   Other Topics Concern  . Not on file   Social History Narrative   Single- dating 2.5 years      College at  [employer], formerly [employer]   [place] work in [place]   Majoring in Surveyor, mineralscriminal justice- not sure on plans, minor in Financial controllerenvironmental science      Hobbies: hunting, fishing, shooting    Past Surgical History  Procedure Laterality Date  . Cleft palate repair    . Cyst excision      left face- benign    Family History  Problem Relation Age of Onset  . Asthma Mother   . Other Father     passed from hunting accident    No Known Allergies  Current Outpatient Prescriptions on File Prior to Visit  Medication Sig Dispense Refill  . albuterol (PROVENTIL) (2.5 MG/3ML) 0.083% nebulizer solution Take 3 mLs (2.5 mg total) by nebulization every 6 (six) hours as needed for wheezing or shortness of breath. 75 mL 3  . amphetamine-dextroamphetamine (ADDERALL XR) 20 MG 24 hr capsule Take 1 capsule (20 mg total) by mouth every morning. 30 capsule 0  . fexofenadine (ALLEGRA) 180 MG tablet TAKE 1 TABLET BY MOUTH EVERY DAY 30 tablet 0  . PROAIR HFA 108 (90 BASE) MCG/ACT inhaler INHALE 2 PUFFS INTO THE LUNGS EVERY 6 HOURS AS NEEDED FOR SHORTNESS OF BREATH 8.5 Inhaler 6  . QVAR 40 MCG/ACT inhaler INHALE 2 PUFFS INTO THE LUNGS 2 (TWO) TIMES DAILY. 8.7 g 5   No current facility-administered medications on file prior to visit.  BP 126/84 mmHg  Temp(Src) 98.1 F (36.7 C) (Oral)  Ht  (1.778 m)  Wt 238 lb (107.956 kg)  BMI 34.15 kg/m2       Objective:   Physical Exam  Constitutional: He is oriented to person, place, and time. He appears well-developed and well-nourished. No distress.  HENT:  Head: Normocephalic and atraumatic.  Right Ear: External ear normal.  Left Ear: External ear normal.  Nose: Nose normal.  Mouth/Throat: Oropharynx is clear and moist. No oropharyngeal exudate.  TM visualized, no cerumen impaction and no acute infection.   Neck: Normal range of motion. Neck supple.  Cardiovascular: Normal rate, regular rhythm, normal heart sounds and intact distal pulses.   Exam reveals no gallop and no friction rub.   No murmur heard. Pulmonary/Chest: Effort normal and breath sounds normal. No respiratory distress. He has no wheezes. He has no rales. He exhibits no tenderness.  Genitourinary: Penis normal. No penile tenderness.  No masses, lumps, swelling, or discoloration of testicles.   Musculoskeletal: Normal range of motion. He exhibits no edema or tenderness.  Lymphadenopathy:    He has no cervical adenopathy.  Neurological: He is alert and oriented to person, place, and time.  Skin: Skin is warm and dry. No rash noted. He is not diaphoretic. No erythema. No pallor.  Psychiatric: He has a normal mood and affect. His behavior is normal. Judgment and thought content normal.  Nursing note and vitals reviewed.      Assessment & Plan:  1. Testicular pain, unspecified - Appears to be resolved -No concern for testicular torsion, orchitis, epididymitis and hydrocele.  - Follow-up with any additional testicular pain  2. Acute maxillary sinusitis, recurrence not specified - Try Flonase and Sudafed for the next 3 days. If no improvement then start antibiotics.  - doxycycline (VIBRAMYCIN) 100 MG capsule; Take 1 capsule (100 mg total) by mouth 2 (two) times daily.  Dispense: 14 capsule; Refill: 0 -Follow-up if no improvement in the next 2 or 3 days

## 2015-12-30 ENCOUNTER — Other Ambulatory Visit: Payer: Self-pay | Admitting: Family Medicine

## 2015-12-30 NOTE — Telephone Encounter (Signed)
° ° ° °  Pt request refill of the following: ° ° °amphetamine-dextroamphetamine (ADDERALL XR) 20 MG 24 hr capsule ° ° °Phamacy: °

## 2015-12-30 NOTE — Telephone Encounter (Signed)
Dr Durene Cal called [facility] and stated a refill is denied at this time as the pt needs an appt next week when he returns to the office.  I left a detailed message at the pts mothers number to return my call as the pt needs an appt.

## 2015-12-31 NOTE — Telephone Encounter (Signed)
Spoke with Mom and told her pt need an appt .

## 2015-12-31 NOTE — Telephone Encounter (Signed)
Left a message for the pts mother  to return my call. 

## 2016-01-01 NOTE — Telephone Encounter (Signed)
Noted  

## 2016-01-12 ENCOUNTER — Telehealth: Payer: Self-pay

## 2016-01-12 NOTE — Telephone Encounter (Signed)
Pt mom calling stating that his son needs his ADD meds but the prescriptions are expired and pt does not have his meds because the pharmacy wont fill them by the way you Rx'd them with the may fill date on there. Pt mom states that she has been told that pt needs to be seen in order to have these Rx's rewritten. Pts professor has approved for pt to miss class this Thursday and mom would like for you to see pt if you can since professor has let pt be excused from this one class on Thursday morning but pt has to be back to [place] for a 1:00. Pt mom said that pt can be here at 8:00am Thursday morning. Pts mom said son is a wreck and he is trying to get throuh exams without his meds, mom is frustrated and she will do whatever needs to be done in order to get her son his meds. She is very emotional on the phone because she feels like no one tried to help her, she has been calling since last Thursday, this is the first time she has actually gotten through to me. Please advise if you can see pt at 8:00 this Thursday morning.

## 2016-01-12 NOTE — Telephone Encounter (Signed)
We called pharmacy and oked the fill

## 2016-01-12 NOTE — Telephone Encounter (Signed)
Pts mom called stating that she has been trying to get in touch with since last Thursday regarding her son and his ADD meds. Pt is attending college at [place] and is out of his ADD meds, the pharmacy will not fill them there because of the way the fill date is written on them. Pt professor has agreed to let pt miss class this Thursday morning to come in to see you to fill his ADD meds if you are willing to work pt in around 8:00 BUT pt has to be back for a 1:00 class on Thursday. Pt mom states no one ever let her talk to me nor sent a message to [place] regarding this situation and kept being told that you would not see the pt until March the 3rd. Pt is having exams and is having a hard time focusing without his medicine. Pt mom is very frustrated and emotional while on the phone and states that she feels like everyone she spoke to kept shutting her down without hearing the story but she was glad to finally get through to me. She states she will do whatever she needs to do in order to get her sons medicine. I told mom that you were seeing patients at the time we were on the phone but i would get a message to you and try to give her a call back within the hour. Please advise if you will work pt in at 8:00am Thursday morning.

## 2016-01-23 ENCOUNTER — Ambulatory Visit: Payer: BLUE CROSS/BLUE SHIELD | Admitting: Family Medicine

## 2016-01-26 ENCOUNTER — Other Ambulatory Visit: Payer: Self-pay | Admitting: *Deleted

## 2016-01-26 MED ORDER — BECLOMETHASONE DIPROPIONATE 40 MCG/ACT IN AERS: INHALATION_SPRAY | RESPIRATORY_TRACT | Status: DC

## 2016-01-27 ENCOUNTER — Other Ambulatory Visit: Payer: Self-pay | Admitting: *Deleted

## 2016-01-27 MED ORDER — BECLOMETHASONE DIPROPIONATE 40 MCG/ACT IN AERS: INHALATION_SPRAY | RESPIRATORY_TRACT | Status: DC

## 2016-03-09 ENCOUNTER — Encounter: Payer: Self-pay | Admitting: Family Medicine

## 2016-03-09 ENCOUNTER — Ambulatory Visit (INDEPENDENT_AMBULATORY_CARE_PROVIDER_SITE_OTHER): Payer: BLUE CROSS/BLUE SHIELD | Admitting: Family Medicine

## 2016-03-09 VITALS — BP 130/70 | HR 80 | Temp 98.9°F | Wt 234.0 lb

## 2016-03-09 DIAGNOSIS — F909 Attention-deficit hyperactivity disorder, unspecified type: Secondary | ICD-10-CM | POA: Diagnosis not present

## 2016-03-09 DIAGNOSIS — F988 Other specified behavioral and emotional disorders with onset usually occurring in childhood and adolescence: Secondary | ICD-10-CM

## 2016-03-09 MED ORDER — AMPHETAMINE-DEXTROAMPHET ER 20 MG PO CP24: 20.0000 mg | ORAL_CAPSULE | ORAL | Status: DC

## 2016-03-09 NOTE — Assessment & Plan Note (Signed)
S: well controlled on Adderall 20mg  XR. He is doing well at school- does miss doses on weekends at times if not having o study.  A/P: we completed a controlled substance contract today. Reviewed appropriate use. Refill provided- call in 3 months for next 3 months then see in 6 months.

## 2016-03-09 NOTE — Patient Instructions (Addendum)
3 more months ADD medicines given. Call before you run out at least 2 weeks early. See us in 6 months  Good job doing monthly self testicular exams. Area you are feeling seems to be your spermatic cord- if you notice any new changes or you have pain call me and I will set up a ultrasound for you.

## 2016-03-09 NOTE — Progress Notes (Signed)
Tana ConchStephen Jefferey Lippmann, MD  Subjective:  Eric Gould is a 20 y.o. year old very pleasant male patient who presents for/with See problem oriented charting ROS- No palpitations, chest pain, unintentional weight loss, shortness of breath  Past Medical History-  Patient Active Problem List   Diagnosis Date Noted  . ADD (attention deficit disorder) 11/06/2013    Priority: High  . Asthma, moderate persistent, well-controlled 11/06/2013    Priority: Medium  . Allergic rhinitis 11/06/2013    Priority: Low    Medications- reviewed and updated Current Outpatient Prescriptions  Medication Sig Dispense Refill  . albuterol (PROVENTIL) (2.5 MG/3ML) 0.083% nebulizer solution Take 3 mLs (2.5 mg total) by nebulization every 6 (six) hours as needed for wheezing or shortness of breath. 75 mL 3  . amphetamine-dextroamphetamine (ADDERALL XR) 20 MG 24 hr capsule Take 1 capsule (20 mg total) by mouth every morning. May fill today 30 capsule 0  . amphetamine-dextroamphetamine (ADDERALL XR) 20 MG 24 hr capsule Take 1 capsule (20 mg total) by mouth every morning. May fill in 1 month 30 capsule 0  . amphetamine-dextroamphetamine (ADDERALL XR) 20 MG 24 hr capsule Take 1 capsule (20 mg total) by mouth every morning. May fill in 2 months 30 capsule 0  . beclomethasone (QVAR) 40 MCG/ACT inhaler INHALE 2 PUFFS INTO THE LUNGS 2 (TWO) TIMES DAILY. 3 Inhaler 1  . doxycycline (VIBRAMYCIN) 100 MG capsule Take 1 capsule (100 mg total) by mouth 2 (two) times daily. 14 capsule 0  . fexofenadine (ALLEGRA) 180 MG tablet TAKE 1 TABLET BY MOUTH EVERY DAY 30 tablet 0  . PROAIR HFA 108 (90 BASE) MCG/ACT inhaler INHALE 2 PUFFS INTO THE LUNGS EVERY 6 HOURS AS NEEDED FOR SHORTNESS OF BREATH 8.5 Inhaler 6   No current facility-administered medications for this visit.    Objective: BP 130/70 mmHg  Pulse 80  Temp(Src) 98.9 F (37.2 C)  Wt 234 lb (106.142 kg) Gen: NAD, resting comfortably CV: RRR no murmurs rubs or gallops Lungs: CTAB  no crackles, wheeze, rhonchi Ext: no edema Skin: warm, dry Neuro: grossly normal, moves all extremities, normal gait  Male genitalia: normal findings: normal circumcised penis, no urethral discharge, scrotal contents normal to inspection and palpation, normal testes palpated bilaterally and no varicocele present  Assessment/Plan:  ADD (attention deficit disorder) S: well controlled on Adderall 20mg  XR. He is doing well at school- does miss doses on weekends at times if not having o study.  A/P: we completed a controlled substance contract today. Reviewed appropriate use. Refill provided- call in 3 months for next 3 months then see in 6 months.   also had concern about areas on his testes as recently started self exams. Discussed he is feeling epidiymis and spermatic cord which are normal- if he notices any changes he will let me know- would order u/s considering prior visit of pain (no longer having) and concern of mass this time- ultrasound would provide reassurance- anatomy appears normal  Return in about 6 months (around 09/08/2016) for follow up- or sooner if needed. Return precautions advised.   Meds ordered this encounter  Medications  . amphetamine-dextroamphetamine (ADDERALL XR) 20 MG 24 hr capsule    Sig: Take 1 capsule (20 mg total) by mouth every morning. May fill today    Dispense:  30 capsule    Refill:  0  . amphetamine-dextroamphetamine (ADDERALL XR) 20 MG 24 hr capsule    Sig: Take 1 capsule (20 mg total) by mouth every morning. May fill  in 1 month    Dispense:  30 capsule    Refill:  0  . amphetamine-dextroamphetamine (ADDERALL XR) 20 MG 24 hr capsule    Sig: Take 1 capsule (20 mg total) by mouth every morning. May fill in 2 months    Dispense:  30 capsule    Refill:  0  Shredded fist set of rx as had date errors  The duration of face-to-face time during this visit was 20 minutes. Greater than 50% of this time was spent in counseling, explanation of diagnosis,  planning of further management, and/or coordination of care.

## 2016-08-13 ENCOUNTER — Other Ambulatory Visit: Payer: BLUE CROSS/BLUE SHIELD

## 2016-08-19 ENCOUNTER — Encounter: Payer: Self-pay | Admitting: Family Medicine

## 2016-08-20 ENCOUNTER — Encounter: Payer: BLUE CROSS/BLUE SHIELD | Admitting: Family Medicine

## 2016-08-26 ENCOUNTER — Other Ambulatory Visit: Payer: Self-pay | Admitting: Family Medicine

## 2016-09-05 ENCOUNTER — Encounter: Payer: Self-pay | Admitting: Family Medicine

## 2016-11-05 ENCOUNTER — Other Ambulatory Visit (INDEPENDENT_AMBULATORY_CARE_PROVIDER_SITE_OTHER): Payer: BLUE CROSS/BLUE SHIELD

## 2016-11-05 DIAGNOSIS — Z0000 Encounter for general adult medical examination without abnormal findings: Secondary | ICD-10-CM | POA: Diagnosis not present

## 2016-11-05 LAB — POC URINALSYSI DIPSTICK (AUTOMATED)
BILIRUBIN UA: NEGATIVE
Blood, UA: NEGATIVE
Glucose, UA: NEGATIVE
KETONES UA: NEGATIVE
LEUKOCYTES UA: NEGATIVE
Nitrite, UA: NEGATIVE
PH UA: 7
Protein, UA: NEGATIVE
Spec Grav, UA: 1.02
Urobilinogen, UA: 0.2

## 2016-11-05 LAB — CBC WITH DIFFERENTIAL/PLATELET
BASOS ABS: 0 10*3/uL (ref 0.0–0.1)
Basophils Relative: 0.4 % (ref 0.0–3.0)
EOS PCT: 2.5 % (ref 0.0–5.0)
Eosinophils Absolute: 0.1 10*3/uL (ref 0.0–0.7)
HCT: 48.7 % (ref 39.0–52.0)
Hemoglobin: 17 g/dL (ref 13.0–17.0)
LYMPHS ABS: 1.7 10*3/uL (ref 0.7–4.0)
Lymphocytes Relative: 31.3 % (ref 12.0–46.0)
MCHC: 34.8 g/dL (ref 30.0–36.0)
MCV: 87.5 fl (ref 78.0–100.0)
MONO ABS: 0.6 10*3/uL (ref 0.1–1.0)
Monocytes Relative: 11.4 % (ref 3.0–12.0)
NEUTROS PCT: 54.4 % (ref 43.0–77.0)
Neutro Abs: 3 10*3/uL (ref 1.4–7.7)
Platelets: 266 10*3/uL (ref 150.0–400.0)
RBC: 5.56 Mil/uL (ref 4.22–5.81)
RDW: 12.9 % (ref 11.5–15.5)
WBC: 5.4 10*3/uL (ref 4.0–10.5)

## 2016-11-05 LAB — BASIC METABOLIC PANEL
BUN: 12 mg/dL (ref 6–23)
CALCIUM: 9.6 mg/dL (ref 8.4–10.5)
CO2: 30 mEq/L (ref 19–32)
CREATININE: 1.11 mg/dL (ref 0.40–1.50)
Chloride: 103 mEq/L (ref 96–112)
GFR: 87.56 mL/min (ref >60.00)
GLUCOSE: 85 mg/dL (ref 70–99)
Potassium: 4.4 mEq/L (ref 3.5–5.1)
Sodium: 140 mEq/L (ref 135–145)

## 2016-11-05 LAB — LIPID PANEL
CHOLESTEROL: 148 mg/dL (ref 0–200)
HDL: 36.9 mg/dL — ABNORMAL LOW (ref >39.00)
LDL CALC: 88 mg/dL (ref 0–99)
NonHDL: 111.22
Total CHOL/HDL Ratio: 4
Triglycerides: 116 mg/dL (ref 0.0–149.0)
VLDL: 23.2 mg/dL (ref 0.0–40.0)

## 2016-11-05 LAB — HEPATIC FUNCTION PANEL
ALT: 28 U/L (ref 0–53)
AST: 22 U/L (ref 0–37)
Albumin: 4.5 g/dL (ref 3.5–5.2)
Alkaline Phosphatase: 59 U/L (ref 39–117)
BILIRUBIN DIRECT: 0.3 mg/dL (ref 0.0–0.3)
BILIRUBIN TOTAL: 1.5 mg/dL — AB (ref 0.2–1.2)
Total Protein: 6.9 g/dL (ref 6.0–8.3)

## 2016-11-05 LAB — TSH: TSH: 4.72 u[IU]/mL — AB (ref 0.35–4.50)

## 2016-11-09 ENCOUNTER — Encounter: Payer: Self-pay | Admitting: Family Medicine

## 2016-11-09 ENCOUNTER — Ambulatory Visit (INDEPENDENT_AMBULATORY_CARE_PROVIDER_SITE_OTHER): Payer: BLUE CROSS/BLUE SHIELD | Admitting: Family Medicine

## 2016-11-09 VITALS — BP 118/80 | HR 93 | Temp 98.6°F | Ht 70.5 in | Wt 224.0 lb

## 2016-11-09 DIAGNOSIS — Z0000 Encounter for general adult medical examination without abnormal findings: Secondary | ICD-10-CM | POA: Diagnosis not present

## 2016-11-09 MED ORDER — AMPHETAMINE-DEXTROAMPHET ER 20 MG PO CP24: 20.0000 mg | ORAL_CAPSULE | ORAL | 0 refills | Status: DC

## 2016-11-09 NOTE — Patient Instructions (Signed)
No major concerns, mild lab abnormalities so would repeat in 1 year with physical  Refilled meds- good for 3 months, can call for refill and will need office visit after that.

## 2016-11-09 NOTE — Progress Notes (Addendum)
Phone: [phone]  Subjective:  Patient presents today for their annual physical. Chief complaint-noted.   See problem oriented charting- ROS- full  review of systems was completed and negative except for: weight loss but this has been intentional with exercise.   The following were reviewed and entered/updated in epic: Past Medical History:  Diagnosis Date  . Asthma    Patient Active Problem List   Diagnosis Date Noted  . ADD (attention deficit disorder) 11/06/2013    Priority: High  . Asthma, moderate persistent, well-controlled 11/06/2013    Priority: Medium  . Allergic rhinitis 11/06/2013    Priority: Low   Past Surgical History:  Procedure Laterality Date  . CLEFT PALATE REPAIR    . CYST EXCISION     left face- benign    Family History  Problem Relation Age of Onset  . Asthma Mother   . Other Father     passed from hunting accident    Medications- reviewed and updated Current Outpatient Prescriptions  Medication Sig Dispense Refill  . albuterol (PROVENTIL) (2.5 MG/3ML) 0.083% nebulizer solution Take 3 mLs (2.5 mg total) by nebulization every 6 (six) hours as needed for wheezing or shortness of breath. 75 mL 3  . amphetamine-dextroamphetamine (ADDERALL XR) 20 MG 24 hr capsule Take 1 capsule (20 mg total) by mouth every morning. May fill today 30 capsule 0  . amphetamine-dextroamphetamine (ADDERALL XR) 20 MG 24 hr capsule Take 1 capsule (20 mg total) by mouth every morning. May fill in 1 month 30 capsule 0  . amphetamine-dextroamphetamine (ADDERALL XR) 20 MG 24 hr capsule Take 1 capsule (20 mg total) by mouth every morning. May fill in 2 months 30 capsule 0  . fexofenadine (ALLEGRA) 180 MG tablet TAKE 1 TABLET BY MOUTH EVERY DAY 30 tablet 0  . PROAIR HFA 108 (90 BASE) MCG/ACT inhaler INHALE 2 PUFFS INTO THE LUNGS EVERY 6 HOURS AS NEEDED FOR SHORTNESS OF BREATH 8.5 Inhaler 6  . QVAR 40 MCG/ACT inhaler INHALE 2 PUFFS INTO THE LUNGS 2 (TWO) TIMES DAILY. 26.1 g 0    No current facility-administered medications for this visit.     Allergies-reviewed and updated No Known Allergies  Social History   Social History  . Marital status: Single    Spouse name: N/A  . Number of children: N/A  . Years of education: N/A   Social History Main Topics  . Smoking status: Never Smoker  . Smokeless tobacco: None  . Alcohol use 0.0 oz/week     Comment: rare social  . Drug use: No  . Sexual activity: Not Asked   Other Topics Concern  . None   Social History Narrative   Single- dating.    Had same GF for 4 years now off in 2017.       [employer] with [employer] spring 2018 then will be done [facility] [employer]. Graduates may 2018. Hoping to work with [name].    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 Oropharynx normal Neck: no thyromegaly CV: RRR no murmurs rubs  or gallops Lungs: CTAB no crackles, wheeze, rhonchi Abdomen: soft/nontender/nondistended/normal bowel sounds. No rebound or guarding.  Ext: no edema Skin: warm, dry Neuro: grossly normal, moves all extremities, PERRLA Declines GU exam- no concerns  Assessment/Plan:  20 y.o. male presenting for annual physical.  Health Maintenance counseling: 1. Anticipatory guidance: Patient counseled regarding regular dental exams, eye exams- sees eye doctor today, wearing seatbelts.  2. Risk factor reduction:  Advised patient of need for regular exercise and diet rich and fruits and vegetables to reduce risk of heart attack and stroke. Exercising 5-6 days a week. Down 14 lbs in last year. Running more as well. Diet reasonable.  3.  Immunizations/screenings/ancillary studies Immunization History  Administered Date(s) Administered  . Influenza-Unspecified 09/24/2015, 09/21/2016  . Tdap 05/29/2015  4. Prostate cancer screening- no family history, start at age 20   5. Colon cancer screening - no family history, start at age 20 6. Skin cancer screening/prevention- advised regular sunscreen use 7. Testicular cancer screening- no issues on regular monthly exams 8. Std screening always using condoms. Tested [employer] freshman year. Opts out STD testing at present.   Status of chronic or acute concerns   Asthma controlled on qvar 40 mcg BID, albuterol about 1x a month or less. Also allegra for allergies. Oil heat bothers it.   ADD well controlled on Adderall XR 20mg  daily. UDS next visit planned.   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    No problem-specific Assessment & Plan notes found for this encounter.  About 6 months- but doesn't always use ADD meds if not in school or working so may stretch longer. Refill by phone after 3 months  Meds ordered this encounter  Medications  . amphetamine-dextroamphetamine (ADDERALL XR) 20 MG 24 hr capsule    Sig: Take 1 capsule (20 mg total) by mouth every morning. May fill today    Dispense:  30 capsule    Refill:  0  . amphetamine-dextroamphetamine (ADDERALL XR) 20 MG 24 hr capsule    Sig: Take 1 capsule (20 mg total) by mouth every morning. May fill in 1 month    Dispense:  30 capsule    Refill:  0  . amphetamine-dextroamphetamine (ADDERALL XR) 20 MG 24 hr capsule    Sig: Take 1 capsule (20 mg total) by mouth every morning. May fill in 2 months    Dispense:  30 capsule    Refill:  0    Return precautions advised.   Tana ConchStephen [name], MD

## 2016-11-09 NOTE — Progress Notes (Signed)
Pre visit review using our clinic review tool, if applicable. No additional management support is needed unless otherwise documented below in the visit note. 

## 2016-12-18 ENCOUNTER — Ambulatory Visit: Payer: BLUE CROSS/BLUE SHIELD | Admitting: Family Medicine

## 2017-02-07 ENCOUNTER — Telehealth: Payer: Self-pay

## 2017-02-07 NOTE — Telephone Encounter (Signed)
PA approved, form faxed back to pharmacy. 

## 2017-02-07 NOTE — Telephone Encounter (Signed)
Received PA request from [facility] for Amphetamine-Dextroamphetamine ER. PA submitted & is pending. Key: [id number]

## 2017-02-10 ENCOUNTER — Encounter: Payer: Self-pay | Admitting: Family Medicine

## 2017-02-10 ENCOUNTER — Ambulatory Visit (INDEPENDENT_AMBULATORY_CARE_PROVIDER_SITE_OTHER): Payer: BLUE CROSS/BLUE SHIELD | Admitting: Family Medicine

## 2017-02-10 VITALS — BP 122/78 | HR 93 | Temp 98.3°F | Ht 70.5 in | Wt 232.6 lb

## 2017-02-10 DIAGNOSIS — F988 Other specified behavioral and emotional disorders with onset usually occurring in childhood and adolescence: Secondary | ICD-10-CM

## 2017-02-10 DIAGNOSIS — R030 Elevated blood-pressure reading, without diagnosis of hypertension: Secondary | ICD-10-CM

## 2017-02-10 NOTE — Patient Instructions (Signed)
BP Readings from Last 3 Encounters:  02/10/17 122/78  11/09/16 118/80  03/09/16 130/70  We repeated on 2 occassions and blood pressure was even lower than the #s above. I have no concerns at present. If you have symptoms such as chest pain, shortness of breath, blurry vision, abnormal headaches, dizziness, heart racing and blood pressure is up then please see us back.   I wonder if the issue issue is the size of your arm from muscle mass but both of our cuffs showed normal blood pressure today

## 2017-02-10 NOTE — Progress Notes (Signed)
Subjective:  Eric Gould is a 21 y.o. year old very pleasant male patient who presents for/with See problem oriented charting ROS- No chest pain or shortness of breath. No headache or blurry vision.  No palpitations.    Past Medical History-  Patient Active Problem List   Diagnosis Date Noted  . ADD (attention deficit disorder) 11/06/2013    Priority: High  . Asthma, moderate persistent, well-controlled 11/06/2013    Priority: Medium  . Allergic rhinitis 11/06/2013    Priority: Low   Medications- reviewed and updated Current Outpatient Prescriptions  Medication Sig Dispense Refill  . albuterol (PROVENTIL) (2.5 MG/3ML) 0.083% nebulizer solution Take 3 mLs (2.5 mg total) by nebulization every 6 (six) hours as needed for wheezing or shortness of breath. 75 mL 3  . amphetamine-dextroamphetamine (ADDERALL XR) 20 MG 24 hr capsule Take 1 capsule (20 mg total) by mouth every morning. May fill today 30 capsule 0  . amphetamine-dextroamphetamine (ADDERALL XR) 20 MG 24 hr capsule Take 1 capsule (20 mg total) by mouth every morning. May fill in 1 month 30 capsule 0  . amphetamine-dextroamphetamine (ADDERALL XR) 20 MG 24 hr capsule Take 1 capsule (20 mg total) by mouth every morning. May fill in 2 months 30 capsule 0  . fexofenadine (ALLEGRA) 180 MG tablet TAKE 1 TABLET BY MOUTH EVERY DAY 30 tablet 0  . PROAIR HFA 108 (90 BASE) MCG/ACT inhaler INHALE 2 PUFFS INTO THE LUNGS EVERY 6 HOURS AS NEEDED FOR SHORTNESS OF BREATH 8.5 Inhaler 6  . QVAR 40 MCG/ACT inhaler INHALE 2 PUFFS INTO THE LUNGS 2 (TWO) TIMES DAILY. 26.1 g 0   No current facility-administered medications for this visit.     Objective: BP 122/78 (BP Location: Left Arm, Patient Position: Sitting, Cuff Size: Large)   Pulse 93   Temp 98.3 F (36.8 C) (Oral)   Ht 5' 10.5" (1.791 m)   Wt 232 lb 9.6 oz (105.5 kg)   SpO2 94%   BMI 32.90 kg/m  Gen: NAD, resting comfortably, athletic build CV: RRR no murmurs rubs or gallops Lungs: CTAB  no crackles, wheeze, rhonchi Ext: no edema Skin: warm, dry Neuro: grossly normal, moves all extremities  Assessment/Plan:  Elevated blood pressure reading Attention deficit disorder (ADD) in adult S: Checked blood pressure at home and was 180/110 then checked the next day and was lower but still in 160s- checked a few minutes later and was 130/80. Mother instructed him to follow up. Also we had discussed with ADD medicines risk of high blood pressure. See ROS- he is completely asymptomatic A/P: 3 checks today <125/80. Both with large and normal cuff. I wonder if home BP cuff is off. Also may be issue just due to the size of his arm- very muscular. May continue ADD meds (took today and bp not elevated in office)  From avs " BP Readings from Last 3 Encounters:  02/10/17 122/78  11/09/16 118/80  03/09/16 130/70  We repeated on 2 occassions and blood pressure was even lower than the #s above. I have no concerns at present. If you have symptoms such as chest pain, shortness of breath, blurry vision, abnormal headaches, dizziness, heart racing and blood pressure is up then please see us back.   I wonder if the issue issue is the size of your arm from muscle mass but both of our cuffs showed normal blood pressure today"  Return precautions advised.  Tana ConchStephen [name], MD

## 2017-02-10 NOTE — Progress Notes (Signed)
Pre visit review using our clinic review tool, if applicable. No additional management support is needed unless otherwise documented below in the visit note. 

## 2017-03-18 ENCOUNTER — Ambulatory Visit: Payer: BLUE CROSS/BLUE SHIELD | Admitting: Family Medicine

## 2017-05-13 ENCOUNTER — Telehealth: Payer: Self-pay | Admitting: Family Medicine

## 2017-05-13 ENCOUNTER — Other Ambulatory Visit: Payer: Self-pay

## 2017-05-13 MED ORDER — AMPHETAMINE-DEXTROAMPHET ER 20 MG PO CP24: 20.0000 mg | ORAL_CAPSULE | ORAL | 0 refills | Status: DC

## 2017-05-13 NOTE — Telephone Encounter (Signed)
Pt needs new rx generic adderall xr 20 mg °

## 2017-05-13 NOTE — Telephone Encounter (Signed)
Prescription printed and placed at the front desk for pick up at his convenience. Called patient and informed him

## 2017-06-01 DIAGNOSIS — S6981XA Other specified injuries of right wrist, hand and finger(s), initial encounter: Secondary | ICD-10-CM | POA: Diagnosis not present

## 2017-06-01 DIAGNOSIS — M25531 Pain in right wrist: Secondary | ICD-10-CM | POA: Diagnosis not present

## 2017-06-06 DIAGNOSIS — M25531 Pain in right wrist: Secondary | ICD-10-CM | POA: Diagnosis not present

## 2017-06-10 DIAGNOSIS — S6981XD Other specified injuries of right wrist, hand and finger(s), subsequent encounter: Secondary | ICD-10-CM | POA: Diagnosis not present

## 2017-06-10 DIAGNOSIS — M25531 Pain in right wrist: Secondary | ICD-10-CM | POA: Diagnosis not present

## 2017-08-25 ENCOUNTER — Telehealth: Payer: Self-pay | Admitting: Family Medicine

## 2017-08-25 ENCOUNTER — Other Ambulatory Visit: Payer: Self-pay

## 2017-08-25 MED ORDER — ALBUTEROL SULFATE HFA 108 (90 BASE) MCG/ACT IN AERS: INHALATION_SPRAY | RESPIRATORY_TRACT | 6 refills | Status: DC

## 2017-08-25 NOTE — Telephone Encounter (Signed)
Refill sent to the pharmacy as requested.

## 2017-08-25 NOTE — Telephone Encounter (Signed)
°  Eric Gould, Eric Gould DOB April 29, 1996 in need of a refill request for emergency inhaler. We are looking for [facility]ill [facility] [place] which was sent to [facility] instead of [facility].

## 2017-08-25 NOTE — Telephone Encounter (Signed)
MEDICATION: PROAIR HFA 108 (90 BASE) MCG/ACT inhaler  PHARMACY:   [id number] [place], [place] - [address] [phone] (Phone) [phone] (Fax)     Please see note below about the emergency request.

## 2017-08-30 ENCOUNTER — Other Ambulatory Visit: Payer: Self-pay | Admitting: Family Medicine

## 2017-10-20 ENCOUNTER — Telehealth: Payer: Self-pay | Admitting: Family Medicine

## 2017-10-20 NOTE — Telephone Encounter (Signed)
Please see refill request below. °

## 2017-10-20 NOTE — Telephone Encounter (Signed)
Called and spoke with Eric SchoolingMom Eric Gould [facility]. It has been since March so patient must come in to be seen before medication can be refilled. Mom verbalized understanding and will schedule an appointment.

## 2017-10-20 NOTE — Telephone Encounter (Signed)
Copied from CRM [phone]. Topic: General - Other >> Oct 20, 2017 12:24 PM Elliot GaultBell, Tiffany M wrote: Jethro Bolusaller name: Mrs Reola CalkinsBeck  Relation to pt: mother  Call back number: [phone] [facility]  [address], [place], [place] [id number] [phone]  Reason for call:  Mother requesting amphetamine-dextroamphetamine (ADDERALL XR) 20 MG 24 hr capsule refill, please advise ( mother states she's aware Dr. Durene CalHunter relocated and will follow him to [place]) please advise

## 2017-10-23 ENCOUNTER — Encounter: Payer: Self-pay | Admitting: Family Medicine

## 2017-10-25 ENCOUNTER — Ambulatory Visit: Payer: Self-pay | Admitting: Family Medicine

## 2017-10-25 NOTE — Progress Notes (Deleted)
Subjective:  Eric Gould A Nephew is a 21 y.o. year old very pleasant male patient who presents for/with See problem oriented charting ROS- ***   Past Medical History-  Patient Active Problem List   Diagnosis Date Noted  . ADD (attention deficit disorder) 11/06/2013    Priority: High  . Asthma, moderate persistent, well-controlled 11/06/2013    Priority: Medium  . Allergic rhinitis 11/06/2013    Priority: Low    Medications- reviewed and updated Current Outpatient Medications  Medication Sig Dispense Refill  . albuterol (PROVENTIL HFA;VENTOLIN HFA) 108 (90 Base) MCG/ACT inhaler INHALE 2 PUFFS INTO THE LUNGS EVERY 6 HOURS AS NEEDED FOR SHORTNESS OF BREATH 8.5 Inhaler 4  . albuterol (PROVENTIL) (2.5 MG/3ML) 0.083% nebulizer solution Take 3 mLs (2.5 mg total) by nebulization every 6 (six) hours as needed for wheezing or shortness of breath. 75 mL 3  . amphetamine-dextroamphetamine (ADDERALL XR) 20 MG 24 hr capsule Take 1 capsule (20 mg total) by mouth every morning. May fill today 30 capsule 0  . amphetamine-dextroamphetamine (ADDERALL XR) 20 MG 24 hr capsule Take 1 capsule (20 mg total) by mouth every morning. May fill in 1 month 30 capsule 0  . amphetamine-dextroamphetamine (ADDERALL XR) 20 MG 24 hr capsule Take 1 capsule (20 mg total) by mouth every morning. May fill in 2 months 30 capsule 0  . fexofenadine (ALLEGRA) 180 MG tablet TAKE 1 TABLET BY MOUTH EVERY DAY 30 tablet 0  . QVAR 40 MCG/ACT inhaler INHALE 2 PUFFS INTO THE LUNGS 2 (TWO) TIMES DAILY. 26.1 g 0   No current facility-administered medications for this visit.     Objective: There were no vitals taken for this visit. Gen: NAD, resting comfortably CV: RRR no murmurs rubs or gallops Lungs: CTAB no crackles, wheeze, rhonchi Abdomen: soft/nontender/nondistended/normal bowel sounds. No rebound or guarding.  Ext: no edema Skin: warm, dry Neuro: grossly normal, moves all extremities  ***  Assessment/Plan:   S: *** controlled  on ***.  BP Readings from Last 3 Encounters:  02/10/17 122/78  11/09/16 118/80  03/09/16 130/70  A/P: We discussed blood pressure goal of <1***40/90. Continue current meds:  ***   S: ***ADD has been controlled on adderall 20mg  XR. Controlled substance contract  On 03/09/16. Diagnosed 01/10/13 by pediatrician, started meds In 6th grade.  A/P: -UDS today There are no Patient Instructions on file for this visit.  No problem-specific Assessment & Plan notes found for this encounter.   Future Appointments  Date Time Provider Department Center  10/25/2017  8:15 AM Shelva MajesticHunter, Donell Tomkins O, MD [facility]   No Follow-up on file.  No orders of the defined types were placed in this encounter.   No orders of the defined types were placed in this encounter.   Return precautions advised.  Tana ConchStephen Jaber Dunlow, MD

## 2017-11-02 ENCOUNTER — Ambulatory Visit: Payer: Self-pay | Admitting: Family Medicine

## 2017-11-09 ENCOUNTER — Ambulatory Visit: Payer: Self-pay | Admitting: Family Medicine

## 2017-11-25 ENCOUNTER — Encounter: Payer: Self-pay | Admitting: Family Medicine

## 2017-11-25 ENCOUNTER — Ambulatory Visit: Payer: BLUE CROSS/BLUE SHIELD | Admitting: Family Medicine

## 2017-11-25 VITALS — BP 132/84 | HR 95 | Temp 97.4°F | Ht 70.5 in | Wt 219.8 lb

## 2017-11-25 DIAGNOSIS — Z79899 Other long term (current) drug therapy: Secondary | ICD-10-CM

## 2017-11-25 DIAGNOSIS — R17 Unspecified jaundice: Secondary | ICD-10-CM | POA: Diagnosis not present

## 2017-11-25 DIAGNOSIS — F988 Other specified behavioral and emotional disorders with onset usually occurring in childhood and adolescence: Secondary | ICD-10-CM | POA: Diagnosis not present

## 2017-11-25 DIAGNOSIS — R7989 Other specified abnormal findings of blood chemistry: Secondary | ICD-10-CM | POA: Diagnosis not present

## 2017-11-25 DIAGNOSIS — J4540 Moderate persistent asthma, uncomplicated: Secondary | ICD-10-CM

## 2017-11-25 LAB — COMPREHENSIVE METABOLIC PANEL
ALBUMIN: 4.9 g/dL (ref 3.5–5.2)
ALT: 25 U/L (ref 0–53)
AST: 25 U/L (ref 0–37)
Alkaline Phosphatase: 54 U/L (ref 39–117)
BUN: 14 mg/dL (ref 6–23)
CHLORIDE: 101 meq/L (ref 96–112)
CO2: 29 meq/L (ref 19–32)
CREATININE: 1.05 mg/dL (ref 0.40–1.50)
Calcium: 9.9 mg/dL (ref 8.4–10.5)
GFR: 92.49 mL/min (ref >60.00)
GLUCOSE: 88 mg/dL (ref 70–99)
Potassium: 4.7 mEq/L (ref 3.5–5.1)
SODIUM: 138 meq/L (ref 135–145)
Total Bilirubin: 1.8 mg/dL — ABNORMAL HIGH (ref 0.2–1.2)
Total Protein: 7.3 g/dL (ref 6.0–8.3)

## 2017-11-25 LAB — TSH: TSH: 3.18 u[IU]/mL (ref 0.35–4.50)

## 2017-11-25 MED ORDER — BECLOMETHASONE DIPROPIONATE 40 MCG/ACT IN AERS: INHALATION_SPRAY | RESPIRATORY_TRACT | 11 refills | Status: DC

## 2017-11-25 MED ORDER — AMPHETAMINE-DEXTROAMPHET ER 20 MG PO CP24: 20.0000 mg | ORAL_CAPSULE | ORAL | 0 refills | Status: DC

## 2017-11-25 NOTE — Progress Notes (Addendum)
Subjective:  Eric Gould is a 22 y.o. year old very pleasant male patient who presents for/with See problem oriented charting ROS- no palpitations. No chest pain or shortness of breath. No edema. Working out regularly and feels well   Past Medical History-  Patient Active Problem List   Diagnosis Date Noted  . Attention deficit disorder (ADD) in adult 11/06/2013    Priority: High  . Asthma, moderate persistent, well-controlled 11/06/2013    Priority: Medium  . Allergic rhinitis 11/06/2013    Priority: Low    Medications- reviewed and updated Current Outpatient Medications  Medication Sig Dispense Refill  . albuterol (PROVENTIL HFA;VENTOLIN HFA) 108 (90 Base) MCG/ACT inhaler INHALE 2 PUFFS INTO THE LUNGS EVERY 6 HOURS AS NEEDED FOR SHORTNESS OF BREATH 8.5 Inhaler 4  . albuterol (PROVENTIL) (2.5 MG/3ML) 0.083% nebulizer solution Take 3 mLs (2.5 mg total) by nebulization every 6 (six) hours as needed for wheezing or shortness of breath. 75 mL 3  . amphetamine-dextroamphetamine (ADDERALL XR) 20 MG 24 hr capsule Take 1 capsule (20 mg total) by mouth every morning. May fill today 30 capsule 0  . amphetamine-dextroamphetamine (ADDERALL XR) 20 MG 24 hr capsule Take 1 capsule (20 mg total) by mouth every morning. May fill in 1 month 30 capsule 0  . amphetamine-dextroamphetamine (ADDERALL XR) 20 MG 24 hr capsule Take 1 capsule (20 mg total) by mouth every morning. May fill in 2 months 30 capsule 0  . QVAR 40 MCG/ACT inhaler INHALE 2 PUFFS INTO THE LUNGS 2 (TWO) TIMES DAILY. 26.1 g 0  . fexofenadine (ALLEGRA) 180 MG tablet TAKE 1 TABLET BY MOUTH EVERY DAY (Patient not taking: Reported on 11/25/2017) 30 tablet 0   Objective: BP 132/84 (BP Location: Left Arm, Patient Position: Sitting, Cuff Size: Large)   Pulse 95   Temp (!) 97.4 F (36.3 C) (Oral)   Ht 5' 10.5" (1.791 m)   Wt 219 lb 12.8 oz (99.7 kg)   SpO2 97%   BMI 31.09 kg/m  Gen: NAD, resting comfortably CV: RRR no murmurs rubs or  gallops Lungs: CTAB no crackles, wheeze, rhonchi Ext: no edema Skin: warm, dry  Assessment/Plan:  Last CPE- elevated TSH so will repeat (was mild). Slight bilirubin elevation (very mild- could be Gilbert's) will repeat. UDS with [place] [employer] so will skip and repeat 1 year  Attention deficit disorder (ADD) in adult S:  ADD well controlled on adderall XR 20mg  daily. Last filled 3 months worth in June of last year- he is using medicine - 5 days a week mainly when he works and then every other weekend. Skips if not working. No palpitations even with working out. Blood pressure has been fine at home  Contract 03/09/16. Had UDS with [place] [employer] and work.  A/P: refilled medication- doing well.    Asthma, moderate persistent, well-controlled S: asthma well controlled on qvar. Using albuterol once a week in the winter- much less in summer. Has not had to use allegra A/P: refill qvar. Luckily not using albuterol frequently.   Elevated TSH - Plan: TSH  Elevated bilirubin - Plan: Comprehensive metabolic panel  High risk medication use  Attention deficit disorder (ADD) in adult  Asthma, moderate persistent, well-controlled  N[facility] reviewed and only has received rx through our office Meds ordered this encounter  Medications  . amphetamine-dextroamphetamine (ADDERALL XR) 20 MG 24 hr capsule    Sig: Take 1 capsule (20 mg total) by mouth every morning. May fill today    Dispense:  30 capsule    Refill:  0  . amphetamine-dextroamphetamine (ADDERALL XR) 20 MG 24 hr capsule    Sig: Take 1 capsule (20 mg total) by mouth every morning. May fill in 1 month    Dispense:  30 capsule    Refill:  0  . amphetamine-dextroamphetamine (ADDERALL XR) 20 MG 24 hr capsule    Sig: Take 1 capsule (20 mg total) by mouth every morning. May fill in 2 months    Dispense:  30 capsule    Refill:  0  . beclomethasone (QVAR) 40 MCG/ACT inhaler    Sig: INHALE 2 PUFFS INTO THE LUNGS 2 (TWO) TIMES DAILY.    Dispense:   26.1 g    Refill:  11    Return precautions advised.  Eric ConchStephen Sparrow [place], MD

## 2017-11-25 NOTE — Assessment & Plan Note (Signed)
S: asthma well controlled on qvar. Using albuterol once a week in the winter- much less in summer. Has not had to use allegra A/P: refill qvar. Luckily not using albuterol frequently.

## 2017-11-25 NOTE — Patient Instructions (Signed)
Can call in 3 months for refill up to another 3 months but have to see you for next refill beyond that.   Glad you are doing well  Refilled qvar electronically. Glad that is going reasonably

## 2017-11-25 NOTE — Assessment & Plan Note (Signed)
S:  ADD well controlled on adderall XR 20mg  daily. Last filled 3 months worth in June of last year- he is using medicine - 5 days a week mainly when he works and then every other weekend. Skips if not working. No palpitations even with working out. Blood pressure has been fine at home  Contract 03/09/16. Had UDS with [name] and work.  A/P: refilled medication- doing well.

## 2017-11-28 ENCOUNTER — Encounter: Payer: Self-pay | Admitting: Family Medicine

## 2018-01-04 ENCOUNTER — Ambulatory Visit: Payer: BLUE CROSS/BLUE SHIELD | Admitting: Family Medicine

## 2018-01-04 ENCOUNTER — Encounter: Payer: Self-pay | Admitting: Family Medicine

## 2018-01-04 VITALS — BP 120/74 | HR 72 | Temp 98.4°F | Ht 70.5 in | Wt 219.8 lb

## 2018-01-04 DIAGNOSIS — M545 Low back pain: Secondary | ICD-10-CM | POA: Diagnosis not present

## 2018-01-04 DIAGNOSIS — G8929 Other chronic pain: Secondary | ICD-10-CM | POA: Diagnosis not present

## 2018-01-04 NOTE — Patient Instructions (Signed)
Schedule visit with Dr. Berline Choughigby tomorrow morning- do this at check out desk before you leave
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
## 2018-01-05 ENCOUNTER — Encounter: Payer: Self-pay | Admitting: Sports Medicine

## 2018-01-05 ENCOUNTER — Ambulatory Visit: Payer: BLUE CROSS/BLUE SHIELD | Admitting: Sports Medicine

## 2018-01-05 VITALS — BP 130/80 | HR 94 | Ht 70.5 in | Wt 217.6 lb

## 2018-01-05 DIAGNOSIS — G8929 Other chronic pain: Secondary | ICD-10-CM

## 2018-01-05 DIAGNOSIS — M9905 Segmental and somatic dysfunction of pelvic region: Secondary | ICD-10-CM | POA: Diagnosis not present

## 2018-01-05 DIAGNOSIS — M9903 Segmental and somatic dysfunction of lumbar region: Secondary | ICD-10-CM

## 2018-01-05 DIAGNOSIS — M9901 Segmental and somatic dysfunction of cervical region: Secondary | ICD-10-CM | POA: Diagnosis not present

## 2018-01-05 DIAGNOSIS — M545 Low back pain, unspecified: Secondary | ICD-10-CM

## 2018-01-05 DIAGNOSIS — M9902 Segmental and somatic dysfunction of thoracic region: Secondary | ICD-10-CM

## 2018-01-05 DIAGNOSIS — M9904 Segmental and somatic dysfunction of sacral region: Secondary | ICD-10-CM | POA: Diagnosis not present
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
## 2018-01-05 NOTE — Patient Instructions (Addendum)
Also check out [employer]"Foundation Training" which is a program developed by Dr. Myles LippsEric Goodman.   There are links to a couple of his YouTube Videos below and I would like to see performing one of his videos 5-6 days per week.    A good intro video is: "Independence from Pain 7-minute Video" - [email or web address]   His more advanced video is: "Powerful Posture and Pain Relief: 12 minutes of Foundation Training" - [email or web address].[email or web address]  Do not try to attempt this entire video when first beginning.    Try breaking of each exercise that he goes into shorter segments.  Otherwise if they perform an exercise for 45 seconds, start with 15 seconds and rest and then resume when they begin the new activity.    If you work your way up to doing this 12 minute video, I expect you will see significant improvements in your pain.  If you enjoy his videos and would like to find out more you can look on his website: [email or web address].  He has a workout streaming option as well as a DVD set available for purchase.  Amazon has the best price for his DVDs.     Please perform the exercise program that we have prepared for you and gone over in detail on a daily basis.  In addition to the handout you were provided you can access your program through: [email or web address]   Your unique program code is: [email or web address]

## 2018-01-10 ENCOUNTER — Telehealth: Payer: Self-pay | Admitting: Physical Therapy
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
## 2018-01-18 ENCOUNTER — Encounter: Payer: Self-pay | Admitting: Sports Medicine

## 2018-01-18 ENCOUNTER — Ambulatory Visit: Payer: BLUE CROSS/BLUE SHIELD | Admitting: Sports Medicine

## 2018-01-18 VITALS — BP 116/84 | HR 104 | Ht 70.5 in | Wt 215.0 lb

## 2018-01-18 DIAGNOSIS — M9901 Segmental and somatic dysfunction of cervical region: Secondary | ICD-10-CM

## 2018-01-18 DIAGNOSIS — M545 Low back pain: Secondary | ICD-10-CM | POA: Diagnosis not present

## 2018-01-18 DIAGNOSIS — M9905 Segmental and somatic dysfunction of pelvic region: Secondary | ICD-10-CM | POA: Diagnosis not present

## 2018-01-18 DIAGNOSIS — M9904 Segmental and somatic dysfunction of sacral region: Secondary | ICD-10-CM | POA: Diagnosis not present

## 2018-01-18 DIAGNOSIS — G8929 Other chronic pain: Secondary | ICD-10-CM

## 2018-01-18 DIAGNOSIS — M9902 Segmental and somatic dysfunction of thoracic region: Secondary | ICD-10-CM | POA: Diagnosis not present

## 2018-01-18 DIAGNOSIS — M9906 Segmental and somatic dysfunction of lower extremity: Secondary | ICD-10-CM

## 2018-01-18 DIAGNOSIS — M9903 Segmental and somatic dysfunction of lumbar region: Secondary | ICD-10-CM

## 2018-01-18 DIAGNOSIS — M9908 Segmental and somatic dysfunction of rib cage: Secondary | ICD-10-CM

## 2018-01-18 NOTE — Procedures (Signed)
PROCEDURE NOTE : OSTEOPATHIC MANIPULATION The decision today to treat with Osteopathic Manipulative Therapy (OMT) was based on physical exam findings. Verbal consent was obtained following a discussion with the patient regarding the of risks, benefits and potential side effects, including an acute pain flare,post manipulation soreness and need for repeat treatments. Contraindications to OMT reviewed and include: NONE   Manipulation was performed as below: Regions Treated: cervial spine, lumbar spine, thoracic spine, sacrum and pelvis Techniques used: HVLA, muscle energy, myofascial release, soft tissue, counterstrain and Articulatory The patient tolerated the treatment well and reported Improved symptoms following treatment today. Patient was given medications, exercises, stretches and lifestyle modifications per AVS and verbally.     OSTEOPATHIC/STRUCTURAL EXAM FINDINGS:    C2 through C4 FRS left  C7 FRS right  Elevated first left rib  Posterior rib 8 right  T2 extended side bent right  T4 through T6 rotated left  L3 FRS right  Left on left sacral torsion  Right anterior innominate
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
## 2018-02-01 ENCOUNTER — Encounter: Payer: Self-pay | Admitting: Sports Medicine

## 2018-02-01 ENCOUNTER — Ambulatory Visit: Payer: BLUE CROSS/BLUE SHIELD | Admitting: Sports Medicine

## 2018-02-01 VITALS — BP 130/86 | HR 84 | Ht 70.5 in | Wt 215.6 lb

## 2018-02-01 DIAGNOSIS — M9901 Segmental and somatic dysfunction of cervical region: Secondary | ICD-10-CM

## 2018-02-01 DIAGNOSIS — G8929 Other chronic pain: Secondary | ICD-10-CM

## 2018-02-01 DIAGNOSIS — M545 Low back pain, unspecified: Secondary | ICD-10-CM | POA: Insufficient documentation

## 2018-02-01 DIAGNOSIS — M9902 Segmental and somatic dysfunction of thoracic region: Secondary | ICD-10-CM | POA: Diagnosis not present

## 2018-02-01 DIAGNOSIS — M9908 Segmental and somatic dysfunction of rib cage: Secondary | ICD-10-CM

## 2018-02-01 DIAGNOSIS — M9903 Segmental and somatic dysfunction of lumbar region: Secondary | ICD-10-CM

## 2018-02-01 DIAGNOSIS — M9905 Segmental and somatic dysfunction of pelvic region: Secondary | ICD-10-CM | POA: Diagnosis not present

## 2018-02-01 DIAGNOSIS — M9904 Segmental and somatic dysfunction of sacral region: Secondary | ICD-10-CM | POA: Diagnosis not present

## 2018-02-01 NOTE — Patient Instructions (Signed)
Please perform the exercise program that we have prepared for you and gone over in detail on a daily basis.  In addition to the handout you were provided you can access your program through: [email or web address]   Your unique program code is:  [email or web address]

## 2018-02-01 NOTE — Procedures (Signed)
PROCEDURE NOTE : OSTEOPATHIC MANIPULATION The decision today to treat with Osteopathic Manipulative Therapy (OMT) was based on physical exam findings. Verbal consent was obtained following a discussion with the patient regarding the of risks, benefits and potential side effects, including an acute pain flare,post manipulation soreness and need for repeat treatments.     NONE  Manipulation was performed as below: Regions Treated: cervial spine, lumbar spine, thoracic spine, sacrum and Ribs Techniques used: HVLA, muscle energy, myofascial release, soft tissue, Articulatory and facilitated positional release   The patient tolerated the treatment well and reported Improved symptoms following treatment today. Patient was given medications, exercises, stretches and lifestyle modifications per AVS and verbally.    OSTEOPATHIC/STRUCTURAL EXAM FINDINGS:    . Somatic dysfunction of cervical region C2 FRS right C2 through C4 rotated left  . Somatic dysfunction of thoracic region  T2 through T6 neutral, rotated left, side bent right T8 through T10 neutral, rotated right, side bent left  . Somatic dysfunction of rib cage region  posterior rib 8 on the right  . Somatic dysfunction of lumbar region  L3 FRS left  . Somatic dysfunction of sacral region  left on left sacral torsion  . Somatic dysfunction of pelvis region  right anterior innominate   

## 2018-02-01 NOTE — Procedures (Signed)
PROCEDURE NOTE: THERAPEUTIC EXERCISES (97110) 15 minutes spent for Therapeutic exercises as below and as referenced in the AVS. This included exercises focusing on stretching, strengthening, with significant focus on eccentric aspects.  Proper technique shown and discussed handout in great detail with ATC. All questions were discussed and answered.   Long term goals include an improvement in range of motion, strength, endurance as well as avoiding reinjury. Frequency of visits is one time as determined during today's  office visit. Frequency of exercises to be performed is as per handout.  EXERCISES REVIEWED: cervical stretching chin tuck scapular stabilization

## 2018-02-01 NOTE — Progress Notes (Signed)
  OBJECTIVE:  VS:  HT:5' 10.5" (179.1 cm)   WT:215 lb 9.6 oz (97.8 kg)  BMI:30.49    BP:130/86  HR:84bpm  TEMP: ( )  RESP:97 %   PHYSICAL EXAM: WDWN, Non-toxic appearing. Psychiatric: Alert & appropriately interactive.  Not depressed or anxious appearing. Respiratory: No increased work of breathing.  Trachea Midline Eyes: Pupils are equal.  EOM intact without nystagmus.  No scleral icterus  NEUROVASCULAR exam: No clubbing or cyanosis appreciated No significant venous stasis changes normal, less than 2 seconds   Bilateral lower extremities overall with better alignment and leg lengths have actually improved in both seated and laying positions with only proximally half a centimeter discrepancy between sides.  Negative straight leg raise bilaterally.  He does have a markedly lordotic lumbar spine but improved mobility.  Cervical range of motion is limited.  He does have tenderness over his scalenes and right greater than left omohyoid insertion on the scapula..  ASSESSMENT & PLAN:   1. Chronic right-sided low back pain without sciatica   2. Somatic dysfunction of cervical region   3. Somatic dysfunction of thoracic region   4. Somatic dysfunction of rib cage region   5. Somatic dysfunction of lumbar region   6. Somatic dysfunction of sacral region   7. Somatic dysfunction of pelvis region     Orders & Meds: No orders of the defined types were placed in this encounter.  No orders of the defined types were placed in this encounter.   PLAN:   Chronic right-sided low back pain without sciatica He reports good improvement with home therapeutic exercises as well as osteopathic manipulation.  Osteopathic manipulation was performed today based on physical exam findings.  Please see procedure note for further information including Osteopathic Exam findings  He continues to have a significant anterior chain dominance and is remarkably weak in stabilizing/intrinsic muscles given his  overall significant muscular build and general strength.  Given the continued steady progress no further diagnostic evaluation indicated at this time.   Follow-up: Return in about 2 weeks (around 02/15/2018) for consideration of repeat osteopathic manipulation.
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
## 2018-02-01 NOTE — Assessment & Plan Note (Signed)
He reports good improvement with home therapeutic exercises as well as osteopathic manipulation.  Osteopathic manipulation was performed today based on physical exam findings.  Please see procedure note for further information including Osteopathic Exam findings  He continues to have a significant anterior chain dominance and is remarkably weak in stabilizing/intrinsic muscles given his overall significant muscular build and general strength.  Given the continued steady progress no further diagnostic evaluation indicated at this time.

## 2018-02-15 ENCOUNTER — Ambulatory Visit (INDEPENDENT_AMBULATORY_CARE_PROVIDER_SITE_OTHER): Payer: BLUE CROSS/BLUE SHIELD | Admitting: Sports Medicine

## 2018-02-15 ENCOUNTER — Encounter: Payer: Self-pay | Admitting: Sports Medicine

## 2018-02-15 VITALS — BP 120/82 | HR 84 | Ht 70.5 in | Wt 216.0 lb

## 2018-02-15 DIAGNOSIS — M9901 Segmental and somatic dysfunction of cervical region: Secondary | ICD-10-CM

## 2018-02-15 DIAGNOSIS — G8929 Other chronic pain: Secondary | ICD-10-CM | POA: Diagnosis not present

## 2018-02-15 DIAGNOSIS — M9903 Segmental and somatic dysfunction of lumbar region: Secondary | ICD-10-CM

## 2018-02-15 DIAGNOSIS — M545 Low back pain: Secondary | ICD-10-CM

## 2018-02-15 DIAGNOSIS — M9905 Segmental and somatic dysfunction of pelvic region: Secondary | ICD-10-CM

## 2018-02-15 DIAGNOSIS — M9904 Segmental and somatic dysfunction of sacral region: Secondary | ICD-10-CM | POA: Diagnosis not present

## 2018-02-15 DIAGNOSIS — M9902 Segmental and somatic dysfunction of thoracic region: Secondary | ICD-10-CM

## 2018-02-15 DIAGNOSIS — M9908 Segmental and somatic dysfunction of rib cage: Secondary | ICD-10-CM | POA: Diagnosis not present

## 2018-02-15 DIAGNOSIS — M2170 Unequal limb length (acquired), unspecified site: Secondary | ICD-10-CM

## 2018-02-15 NOTE — Assessment & Plan Note (Signed)
Osteopathic manipulation was performed today based on physical exam findings.  Please see procedure note for further information including Osteopathic Exam findings  Continue with home therapeutic exercises.  He is continuing to make good improvements.  We did discuss that obtaining an MRI is possibility but would not necessarily change our management at this time and he is agreeable to continued conservative care and continue manipulation as indicated.  His headaches and low back pain have improved in severity and are happening less frequently but still are quite troublesome throughout the day.  No pain waking up at night.
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
## 2018-02-18 ENCOUNTER — Encounter: Payer: Self-pay | Admitting: Sports Medicine

## 2018-02-18 DIAGNOSIS — M2170 Unequal limb length (acquired), unspecified site: Secondary | ICD-10-CM | POA: Insufficient documentation

## 2018-02-18 NOTE — Procedures (Signed)
PROCEDURE NOTE : OSTEOPATHIC MANIPULATION The decision today to treat with Osteopathic Manipulative Therapy (OMT) was based on physical exam findings. Verbal consent was obtained following a discussion with the patient regarding the of risks, benefits and potential side effects, including an acute pain flare,post manipulation soreness and need for repeat treatments.     NONE  Manipulation was performed as below: Regions Treated: cervial spine, lumbar spine, thoracic spine, sacrum and Ribs Techniques used: HVLA, muscle energy, myofascial release, soft tissue, Articulatory and facilitated positional release   The patient tolerated the treatment well and reported Improved symptoms following treatment today. Patient was given medications, exercises, stretches and lifestyle modifications per AVS and verbally.    OSTEOPATHIC/STRUCTURAL EXAM FINDINGS:    . Somatic dysfunction of cervical region C2 FRS right C2 through C4 rotated left  . Somatic dysfunction of thoracic region  T2 through T6 neutral, rotated left, side bent right T8 through T10 neutral, rotated right, side bent left  . Somatic dysfunction of rib cage region  posterior rib 8 on the right  . Somatic dysfunction of lumbar region  L3 FRS left  . Somatic dysfunction of sacral region  left on left sacral torsion  . Somatic dysfunction of pelvis region  right anterior innominate

## 2018-02-18 NOTE — Assessment & Plan Note (Signed)
Continue 3/16" lift

## 2018-02-19 ENCOUNTER — Encounter: Payer: Self-pay | Admitting: Sports Medicine

## 2018-02-27 ENCOUNTER — Telehealth: Payer: Self-pay | Admitting: Family Medicine

## 2018-02-27 ENCOUNTER — Ambulatory Visit: Payer: BLUE CROSS/BLUE SHIELD | Admitting: Sports Medicine

## 2018-02-27 ENCOUNTER — Encounter: Payer: Self-pay | Admitting: Sports Medicine

## 2018-02-27 VITALS — BP 124/80 | HR 96 | Ht 70.5 in | Wt 223.4 lb

## 2018-02-27 DIAGNOSIS — M9903 Segmental and somatic dysfunction of lumbar region: Secondary | ICD-10-CM | POA: Diagnosis not present

## 2018-02-27 DIAGNOSIS — M545 Low back pain, unspecified: Secondary | ICD-10-CM

## 2018-02-27 DIAGNOSIS — G8929 Other chronic pain: Secondary | ICD-10-CM

## 2018-02-27 DIAGNOSIS — M9908 Segmental and somatic dysfunction of rib cage: Secondary | ICD-10-CM | POA: Diagnosis not present

## 2018-02-27 DIAGNOSIS — M9902 Segmental and somatic dysfunction of thoracic region: Secondary | ICD-10-CM

## 2018-02-27 DIAGNOSIS — M9905 Segmental and somatic dysfunction of pelvic region: Secondary | ICD-10-CM

## 2018-02-27 DIAGNOSIS — M9904 Segmental and somatic dysfunction of sacral region: Secondary | ICD-10-CM | POA: Diagnosis not present

## 2018-02-27 DIAGNOSIS — M9901 Segmental and somatic dysfunction of cervical region: Secondary | ICD-10-CM | POA: Diagnosis not present

## 2018-02-27 MED ORDER — AMPHETAMINE-DEXTROAMPHET ER 20 MG PO CP24: 20.0000 mg | ORAL_CAPSULE | ORAL | 0 refills | Status: DC

## 2018-02-27 MED ORDER — SCOPOLAMINE 1 MG/3DAYS TD PT72: 1.0000 | MEDICATED_PATCH | TRANSDERMAL | 0 refills | Status: DC

## 2018-02-27 NOTE — Addendum Note (Signed)
Addended by: Shelva MajesticHUNTER, Alaia Lordi O on: 02/27/2018 08:14 PM   Modules accepted: Orders

## 2018-02-27 NOTE — Telephone Encounter (Signed)
MEDICATION: amphetamine- dextroamphetamine (Adderall XR) 20 MG 24 hr capsule  PHARMACY:  [id number] - [place], [place] - [address]  IS THIS A 90 DAY SUPPLY :   IS PATIENT OUT OF MEDICATION:   IF NOT; HOW MUCH IS LEFT:   LAST APPOINTMENT DATE: @4 /06/2018  NEXT APPOINTMENT DATE:@Visit  date not found  OTHER COMMENTS: Patient states Dr. Durene CalHunter usually calls this in without requiring a visit.    **Let patient know to contact pharmacy at the end of the day to make sure medication is ready. **  ** Please notify patient to allow 48-72 hours to process**  **Encourage patient to contact the pharmacy for refills or they can request refills through [facility]**

## 2018-02-27 NOTE — Telephone Encounter (Signed)
See note

## 2018-02-27 NOTE — Telephone Encounter (Signed)
Pt states he is going on a cruise and would like motion sickness medication. Please advise.

## 2018-02-27 NOTE — Telephone Encounter (Signed)
EDIT TO ADD:   Medication is a 30 day supply  Pt has 28 days left

## 2018-02-27 NOTE — Telephone Encounter (Signed)
Pt mother called to give updated pharmacy. Meds should be called into:  [id number] - [place], [place] - [address] [phone] (Phone) [phone] (Fax)

## 2018-02-27 NOTE — Telephone Encounter (Signed)
I sent this in
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
## 2018-02-27 NOTE — Telephone Encounter (Signed)
I sent this in. For future ADD visits- please let me know  1. Date of last visit with me for ADD 2. How many months of fills have been provided on that date and beyond (usually 3) 3. Review my last note to make sure nothing patient is missing such as UDS or contract

## 2018-03-01 ENCOUNTER — Ambulatory Visit: Payer: BLUE CROSS/BLUE SHIELD | Admitting: Sports Medicine

## 2018-03-13 NOTE — Telephone Encounter (Signed)
Noted  

## 2018-03-15 NOTE — Telephone Encounter (Signed)
Prescription was sent in by Dr. Durene CalHunter

## 2018-03-16 ENCOUNTER — Ambulatory Visit: Payer: BLUE CROSS/BLUE SHIELD | Admitting: Sports Medicine

## 2018-03-23 ENCOUNTER — Ambulatory Visit: Payer: BLUE CROSS/BLUE SHIELD | Admitting: Sports Medicine

## 2018-03-30 ENCOUNTER — Ambulatory Visit: Payer: BLUE CROSS/BLUE SHIELD | Admitting: Sports Medicine

## 2018-07-27 ENCOUNTER — Other Ambulatory Visit: Payer: Self-pay | Admitting: Family Medicine

## 2018-07-27 NOTE — Telephone Encounter (Signed)
Copied from CRM [id number]. Topic: Quick Communication - Rx Refill/Question >> Jul 27, 2018  9:05 AM Baldo Daub L wrote: Medication: amphetamine-dextroamphetamine (ADDERALL XR) 20 MG 24 hr capsule  Has the patient contacted their pharmacy? No - controlled substance.  INSURANCE WILL END ON FRIDAY (Agent: If no, request that the patient contact the pharmacy for the refill.) (Agent: If yes, when and what did the pharmacy advise?)  Preferred Pharmacy (with phone number or street name): [id number] - [place], [place] - [address] [place] [address] [phone] (Phone) [phone] (Fax)  Agent: Please be advised that RX refills may take up to 3 business days. We ask that you follow-up with your pharmacy.

## 2018-07-27 NOTE — Telephone Encounter (Signed)
Rx refill request: Adderall XR 20 mg    Last filled: 02/27/18  ( gets 3 Rx)  LOV: 11/25/17  PCP: Durene Cal  Pharmacy: verified  Patient states insurance ends Friday

## 2018-07-27 NOTE — Telephone Encounter (Signed)
See note

## 2018-07-28 MED ORDER — AMPHETAMINE-DEXTROAMPHET ER 20 MG PO CP24: 20.0000 mg | ORAL_CAPSULE | ORAL | 0 refills | Status: DC

## 2018-07-28 NOTE — Telephone Encounter (Signed)
Mom following up on refill request.  Mom also wants to know when does the pt need to come for recheck? Insurance ends today. So mom hopes she can get today.

## 2018-07-28 NOTE — Telephone Encounter (Signed)
Spoke with mom. Eric Gould is terrified of asking to leave 45 minutes early as this is his last day and he is working his notice. I told mom we could send in 30 day prescription today. Eric Gould is out of town next week for orientation at new job but he will call when he gets back. It is believed that new insurance will kick in after 30 days so he will schedule for a month to come in.

## 2018-07-28 NOTE — Telephone Encounter (Signed)
He is actually due for a visit NOW as I already did 6 month of rx from last visit . Last visit for ADD was January. Can we squeeze him in at 4 15 today- then I can refill up to 6 months. If he cant come today- can do a 30 day fill on this but would need to space out rx and work on getting insurance back or be self pay.

## 2018-07-28 NOTE — Telephone Encounter (Signed)
Please advise on refill. Patient will not have insurance after today.

## 2018-07-28 NOTE — Telephone Encounter (Signed)
Patients mother is going to call and see if patient can leave work to come to appointment. Patient starts his new job on Monday and will have insurance again after the 30 days. Will wait til mother calls back.

## 2018-09-14 ENCOUNTER — Ambulatory Visit: Payer: BLUE CROSS/BLUE SHIELD | Admitting: Family Medicine

## 2018-09-19 ENCOUNTER — Ambulatory Visit: Payer: BLUE CROSS/BLUE SHIELD | Admitting: Family Medicine

## 2018-10-13 ENCOUNTER — Telehealth: Payer: Self-pay | Admitting: Family Medicine

## 2018-10-13 NOTE — Telephone Encounter (Signed)
Pt is calling to schedule an medication follow-up appt for refill on his adderall with Dr. Durene CalHunter. Dr. Erasmo LeventhalHunter's first available OV is 11/23/2018. Pt would like to see if he could possibly be worked in. Please advise.

## 2018-10-17 ENCOUNTER — Encounter: Payer: Self-pay | Admitting: Family Medicine

## 2018-10-17 ENCOUNTER — Ambulatory Visit (INDEPENDENT_AMBULATORY_CARE_PROVIDER_SITE_OTHER): Payer: BLUE CROSS/BLUE SHIELD | Admitting: Family Medicine

## 2018-10-17 VITALS — BP 104/80 | HR 58 | Temp 98.1°F | Ht 70.5 in | Wt 235.4 lb

## 2018-10-17 DIAGNOSIS — Z23 Encounter for immunization: Secondary | ICD-10-CM | POA: Diagnosis not present

## 2018-10-17 DIAGNOSIS — Z6833 Body mass index (BMI) 33.0-33.9, adult: Secondary | ICD-10-CM

## 2018-10-17 DIAGNOSIS — J4540 Moderate persistent asthma, uncomplicated: Secondary | ICD-10-CM

## 2018-10-17 DIAGNOSIS — F988 Other specified behavioral and emotional disorders with onset usually occurring in childhood and adolescence: Secondary | ICD-10-CM

## 2018-10-17 MED ORDER — AMPHETAMINE-DEXTROAMPHET ER 20 MG PO CP24: 20.0000 mg | ORAL_CAPSULE | ORAL | 0 refills | Status: DC

## 2018-10-17 MED ORDER — ALBUTEROL SULFATE HFA 108 (90 BASE) MCG/ACT IN AERS: INHALATION_SPRAY | RESPIRATORY_TRACT | 4 refills | Status: DC

## 2018-10-17 NOTE — Patient Instructions (Addendum)
Refills for 3 months provided- can call in for 1 more set of refills and need to see each other after that  Can move back January physical I fyou want.   Refilled albuterol. If you start using regularly more than twice a week or having nighttime coughing fits that wake you up weekly- I want to know as would restart qvar but thrilled you are doing well without this

## 2018-10-17 NOTE — Progress Notes (Signed)
Subjective:  Eric Gould is a 22 y.o. year old very pleasant male patient who presents for/with See problem oriented charting ROS- No chest pain or shortness of breath. No headache or blurry vision.  No palpitations   Past Medical History-  Patient Active Problem List   Diagnosis Date Noted  . Attention deficit disorder (ADD) in adult 11/06/2013    Priority: High  . Asthma, moderate persistent, well-controlled 11/06/2013    Priority: Medium  . Allergic rhinitis 11/06/2013    Priority: Low  . Leg length inequality 02/18/2018  . Chronic right-sided low back pain without sciatica 02/01/2018    Medications- reviewed and updated Current Outpatient Medications  Medication Sig Dispense Refill  . albuterol (PROVENTIL HFA;VENTOLIN HFA) 108 (90 Base) MCG/ACT inhaler INHALE 2 PUFFS INTO THE LUNGS EVERY 6 HOURS AS NEEDED FOR SHORTNESS OF BREATH 18 Inhaler 4  . amphetamine-dextroamphetamine (ADDERALL XR) 20 MG 24 hr capsule Take 1 capsule (20 mg total) by mouth every morning. May fill today 30 capsule 0  . amphetamine-dextroamphetamine (ADDERALL XR) 20 MG 24 hr capsule Take 1 capsule (20 mg total) by mouth every morning. May fill in 1 month 30 capsule 0  . amphetamine-dextroamphetamine (ADDERALL XR) 20 MG 24 hr capsule Take 1 capsule (20 mg total) by mouth every morning. May fill today 30 capsule 0  . Ibuprofen-Famotidine (DUEXIS) 800-26.6 MG TABS Take 1 tablet by mouth 3 (three) times daily.     No current facility-administered medications for this visit.     Objective: BP 104/80 (BP Location: Left Arm, Patient Position: Sitting, Cuff Size: Large)   Pulse (!) 58   Temp 98.1 F (36.7 C) (Oral)   Ht 5' 10.5" (1.791 m)   Wt 235 lb 6.4 oz (106.8 kg)   SpO2 97%   BMI 33.30 kg/m  Gen: NAD, athletic CV: RRR no murmurs rubs or gallops Lungs: CTAB no crackles, wheeze, rhonchi Abdomen: soft/nontender/nondistended/normal bowel sounds.  Ext: no edema Skin: warm, dry  Assessment/Plan: Other  notes: 1.  Things going well with current girlfriend-has been with her for 2 to 3 years.  Will be spending some time with her family for Thanksgiving.  Asthma, moderate persistent, well-controlled S: Patient has done well without Qvar recently- ran out 3 months ago and hasn't had issues.  He is only on albuterol and using about once a week in the winter but rare in summer.   A/P: Fortunately patient doing well- he has self stepped down his therapy but seems to be appropriate-continue off Qvar.  Continue as needed albuterol  Attention deficit disorder (ADD) in adult S: Patient continues to do well on Adderall 20 mg extended release daily.  Typically uses 5 days a week M-F unless doing a lot of driving on weekend.  Skips if not workin mostlyg.  Still waiting on getting into [name]-now working for [employer] A/P: Stable- refilled medications Adderall 20 mg extended release for 3 months- can refill one more time was 3 before next visit -Controlled substance contract 03/09/2016 - Has UDS with [employer] and work- just started with [employer] 1 month ago.  - Diagnosed by [name] of pediatrics- records scanned in - reviewed NCCSRS and no high risk usage- only rx through us here.   Future Appointments  Date Time Provider Department Center  12/11/2018  3:40 PM Shelva MajesticHunter, [name], MD [facility]  He may in the pushing this appointment back till next refill needed  Lab/Order associations: Need for prophylactic vaccination and inoculation  against influenza - Plan: Flu Vaccine QUAD 6+ mos PF IM (Fluarix Quad PF)  BMI 33.0-33.9,adult  Asthma, moderate persistent, well-controlled  Attention deficit disorder (ADD) in adult  Meds ordered this encounter  Medications  . amphetamine-dextroamphetamine (ADDERALL XR) 20 MG 24 hr capsule    Sig: Take 1 capsule (20 mg total) by mouth every morning. May fill today    Dispense:  30 capsule    Refill:  0  . amphetamine-dextroamphetamine (ADDERALL XR) 20  MG 24 hr capsule    Sig: Take 1 capsule (20 mg total) by mouth every morning. May fill in 1 month    Dispense:  30 capsule    Refill:  0  . amphetamine-dextroamphetamine (ADDERALL XR) 20 MG 24 hr capsule    Sig: Take 1 capsule (20 mg total) by mouth every morning. May fill today    Dispense:  30 capsule    Refill:  0  . albuterol (PROVENTIL HFA;VENTOLIN HFA) 108 (90 Base) MCG/ACT inhaler    Sig: INHALE 2 PUFFS INTO THE LUNGS EVERY 6 HOURS AS NEEDED FOR SHORTNESS OF BREATH    Dispense:  18 Inhaler    Refill:  4    Return precautions advised.  Tana Conch, MD

## 2018-10-17 NOTE — Assessment & Plan Note (Signed)
S: Patient has done well without Qvar recently- ran out 3 months ago and hasn't had issues.  He is only on albuterol and using about once a week in the winter but rare in summer.   A/P: Fortunately patient doing well- he has self stepped down his therapy but seems to be appropriate-continue off Qvar.  Continue as needed albuterol

## 2018-10-17 NOTE — Telephone Encounter (Signed)
Patient is scheduled for today to see Dr. Durene CalHunter

## 2018-10-17 NOTE — Assessment & Plan Note (Signed)
S: Patient continues to do well on Adderall 20 mg extended release daily.  Typically uses 5 days a week M-F unless doing a lot of driving on weekend.  Skips if not workin mostlyg.  Still waiting on getting into [name]-now working for [employer] A/P: Stable- refilled medications Adderall 20 mg extended release for 3 months- can refill one more time was 3 before next visit -Controlled substance contract 03/09/2016 - Has UDS with [employer] and work- just started with [employer] 1 month ago.  - Diagnosed by [name] of pediatrics- records scanned in - reviewed NCCSRS and no high risk usage- only rx through us here.

## 2018-10-30 ENCOUNTER — Encounter: Payer: BLUE CROSS/BLUE SHIELD | Admitting: Family Medicine

## 2018-11-21 ENCOUNTER — Telehealth: Payer: Self-pay | Admitting: Family Medicine

## 2018-11-21 NOTE — Telephone Encounter (Signed)
Copied from CRM [id number]. Topic: Quick Communication - See Telephone Encounter >> Nov 21, 2018  1:19 PM Terisa Starraylor, Brittany L wrote: CRM for notification. See Telephone encounter for: 11/21/18.  Patient states he is with a new job and has new insurance. He said that his amphetamine-dextroamphetamine (ADDERALL XR) 20 MG 24 hr capsule is going [facility] be to [facility] but went ahead and picked up the prescription for December. He said he needs January and February's [facility] [name]. He checked with [facility] and it is much cheaper there. He also will be using this as his primary pharmacy and needs his albuterol (PROVENTIL HFA;VENTOLIN HFA) 108 (90 Base) MCG/ACT inhaler sent there as well. He is out of the inhaler.

## 2018-11-21 NOTE — Telephone Encounter (Signed)
Patient states he has a new job and [employer] insurance. States his amphetamine-dextroamphetamine (ADDERALL XR) 20 MG 24 hr capsule [facility] but went ahead and picked up the prescription for December. Requesting January and February's script be [facility] at [address]  He checked with [facility] and it is much cheaper there. He will be using this as his primary pharmacy. Requesting albuterol (PROVENTIL HFA;VENTOLIN HFA) 108 (90 Base) MCG/ACT inhaler sent there as well. He is out of the inhaler.

## 2018-11-23 ENCOUNTER — Other Ambulatory Visit: Payer: Self-pay

## 2018-11-23 MED ORDER — AMPHETAMINE-DEXTROAMPHET ER 20 MG PO CP24: 20.0000 mg | ORAL_CAPSULE | ORAL | 0 refills | Status: DC

## 2018-11-23 MED ORDER — ALBUTEROL SULFATE HFA 108 (90 BASE) MCG/ACT IN AERS: INHALATION_SPRAY | RESPIRATORY_TRACT | 4 refills | Status: DC

## 2018-11-23 NOTE — Telephone Encounter (Signed)
I refilled these-1 albuterol and 2 Adderall-please call his former pharmacy and have them cancel the Adderall's that were sent there.

## 2018-11-23 NOTE — Telephone Encounter (Signed)
See note

## 2018-11-23 NOTE — Telephone Encounter (Signed)
Pharmacy updated to [facility]  

## 2018-11-30 NOTE — Telephone Encounter (Signed)
Called and cancelled prescriptions as requested from [facility]

## 2018-12-11 ENCOUNTER — Encounter: Payer: Self-pay | Admitting: Family Medicine

## 2019-01-23 ENCOUNTER — Ambulatory Visit (INDEPENDENT_AMBULATORY_CARE_PROVIDER_SITE_OTHER): Payer: BLUE CROSS/BLUE SHIELD | Admitting: Family Medicine

## 2019-01-23 ENCOUNTER — Encounter: Payer: Self-pay | Admitting: Family Medicine

## 2019-01-23 VITALS — BP 110/80 | HR 81 | Temp 98.4°F | Ht 70.0 in | Wt 226.2 lb

## 2019-01-23 DIAGNOSIS — Z79899 Other long term (current) drug therapy: Secondary | ICD-10-CM

## 2019-01-23 DIAGNOSIS — M542 Cervicalgia: Secondary | ICD-10-CM | POA: Diagnosis not present

## 2019-01-23 DIAGNOSIS — Z13220 Encounter for screening for lipoid disorders: Secondary | ICD-10-CM | POA: Diagnosis not present

## 2019-01-23 DIAGNOSIS — Z0000 Encounter for general adult medical examination without abnormal findings: Secondary | ICD-10-CM | POA: Diagnosis not present

## 2019-01-23 LAB — COMPREHENSIVE METABOLIC PANEL
ALK PHOS: 51 U/L (ref 39–117)
ALT: 26 U/L (ref 0–53)
AST: 29 U/L (ref 0–37)
Albumin: 4.9 g/dL (ref 3.5–5.2)
BUN: 16 mg/dL (ref 6–23)
CO2: 26 meq/L (ref 19–32)
Calcium: 9.7 mg/dL (ref 8.4–10.5)
Chloride: 102 mEq/L (ref 96–112)
Creatinine, Ser: 0.97 mg/dL (ref 0.40–1.50)
GFR: 94.44 mL/min (ref >60.00)
GLUCOSE: 75 mg/dL (ref 70–99)
POTASSIUM: 4.2 meq/L (ref 3.5–5.1)
Sodium: 136 mEq/L (ref 135–145)
TOTAL PROTEIN: 7.4 g/dL (ref 6.0–8.3)
Total Bilirubin: 1.7 mg/dL — ABNORMAL HIGH (ref 0.2–1.2)

## 2019-01-23 LAB — LIPID PANEL
Cholesterol: 149 mg/dL (ref 0–200)
HDL: 38.1 mg/dL — AB (ref >39.00)
LDL Cholesterol: 101 mg/dL — ABNORMAL HIGH (ref 0–99)
NONHDL: 111.34
Total CHOL/HDL Ratio: 4
Triglycerides: 54 mg/dL (ref 0.0–149.0)
VLDL: 10.8 mg/dL (ref 0.0–40.0)

## 2019-01-23 LAB — CBC
HEMATOCRIT: 47.5 % (ref 39.0–52.0)
HEMOGLOBIN: 16.3 g/dL (ref 13.0–17.0)
MCHC: 34.2 g/dL (ref 30.0–36.0)
MCV: 88.5 fl (ref 78.0–100.0)
Platelets: 261 10*3/uL (ref 150.0–400.0)
RBC: 5.37 Mil/uL (ref 4.22–5.81)
RDW: 12.6 % (ref 11.5–15.5)
WBC: 5.4 10*3/uL (ref 4.0–10.5)

## 2019-01-23 MED ORDER — AMPHETAMINE-DEXTROAMPHET ER 20 MG PO CP24: 20.0000 mg | ORAL_CAPSULE | ORAL | 0 refills | Status: DC

## 2019-01-23 NOTE — Patient Instructions (Addendum)
Can schedule with Dr. Noralyn Pick before you leave for PT follow up   Please stop by lab before you go If you do not have mychart- we will call you about results within 5 business days of [place] receiving them.  If you have mychart- we will send your results within 3 business days of [place] receiving them.  If abnormal or we want to clarify a result, we will call or mychart you to make sure you receive the message.  If you have questions or concerns or don't hear within 5-7 days, please send [place] a message or call us.    If when we call you about results we dont mention the form - then ask them to make sure we submit that

## 2019-01-23 NOTE — Progress Notes (Signed)
Phone: [phone]    Subjective:  Patient presents today for their annual physical. Chief complaint-noted.   See problem oriented charting- ROS- full  review of systems was completed and negative including No chest pain or shortness of breath. No headache or blurry vision.   The following were reviewed and entered/updated in epic: Past Medical History:  Diagnosis Date  . Asthma    Patient Active Problem List   Diagnosis Date Noted  . Attention deficit disorder (ADD) in adult 11/06/2013    Priority: High  . Asthma, moderate persistent, well-controlled 11/06/2013    Priority: Medium  . Allergic rhinitis 11/06/2013    Priority: Low  . Leg length inequality 02/18/2018  . Chronic right-sided low back pain without sciatica 02/01/2018   Past Surgical History:  Procedure Laterality Date  . CLEFT PALATE REPAIR    . CYST EXCISION     left face- benign    Family History  Problem Relation Age of Onset  . Asthma Mother   . Other Father        passed from hunting accident    Medications- reviewed and updated Current Outpatient Medications  Medication Sig Dispense Refill  . albuterol (PROVENTIL HFA;VENTOLIN HFA) 108 (90 Base) MCG/ACT inhaler INHALE 2 PUFFS INTO THE LUNGS EVERY 6 HOURS AS NEEDED FOR SHORTNESS OF BREATH 18 Inhaler 4  . amphetamine-dextroamphetamine (ADDERALL XR) 20 MG 24 hr capsule Take 1 capsule (20 mg total) by mouth every morning. May fill today 30 capsule 0  . amphetamine-dextroamphetamine (ADDERALL XR) 20 MG 24 hr capsule Take 1 capsule (20 mg total) by mouth every morning. May fill in 2 months 30 capsule 0  . amphetamine-dextroamphetamine (ADDERALL XR) 20 MG 24 hr capsule Take 1 capsule (20 mg total) by mouth every morning. May fill in 1 month 30 capsule 0   No current facility-administered medications for this visit.     Allergies-reviewed and updated No Known Allergies  Social History   Social History Narrative   Single- dating.    Had same GF for  4 years now off in 2017.       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 trying to get into [employer] [place] still but waiting for his "wave" Interned with [place] [employer] spring 2018    Graduated may 2018 from [place]. 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 Oropharynx normal Neck: no thyromegaly, some tension in right trapezius with turning head and then putting chin to chest CV: RRR no murmurs rubs or gallops Lungs: CTAB no crackles, wheeze, rhonchi Abdomen: soft/nontender/nondistended/normal bowel sounds. No rebound or guarding.  Ext: no edema Skin: warm, dry Neuro: grossly normal, moves all extremities, PERRLA    Assessment and Plan:  23 y.o. male presenting for annual physical.  Health Maintenance counseling: 1. Anticipatory guidance: Patient counseled regarding regular dental exams -q6 months, eye exams - no issues,  avoiding smoking and second hand smoke , limiting alcohol to 2 beverages per day- about 1 a week.  2. Risk factor reduction:  Advised patient of need for regular exercise and diet rich and fruits and vegetables to reduce risk of heart attack and stroke. Exercise- has maintained his regular exercise. Diet-down 9 lbs- trying to eat cleaner after moving in with GF (also spent thanksgiving with GF family).  Wt Readings from Last 3 Encounters:  01/23/19 226 lb 3.2 oz (102.6 kg)  10/17/18 235 lb 6.4 oz (106.8 kg)  02/27/18 223 lb 6.4 oz (101.3 kg)  3. Immunizations/screenings/ancillary studies- up to date Immunization History  Administered Date(s) Administered  . Hpv 05/06/2011, 07/08/2011,  11/18/2011  . Influenza Inj Mdck Quad Pf 09/02/2017  . Influenza,inj,Quad PF,6+ Mos 10/17/2018  . Influenza-Unspecified 09/24/2015, 09/21/2016, 07/23/2017  . MMR 04/06/1995, 08/05/1998  . Tdap 05/29/2015  4. Prostate cancer screening- no family history, start at age 26  5. Colon cancer screening - no family history, start at age 1 6. Skin cancer screening/prevention- has seen derm in the pastadvised regular sunscreen use. Denies worrisome, changing, or new skin lesions. Wart right wrist- declines cryotherapy for now.  7. Testicular cancer screening- advised monthly self exams  8. STD screening- patient opts out- monogomous with GF 9.  Never smoker-   Status of chronic or acute concerns  Asthma-doing well with albuterol aloen- perhaps once a monte.  No longer having to use Qvar.   ADD-doing well on 20 mg extended release. Finds this helpful- primarily just during the week -Refill for 3 months  - Can do 1 more set of 3 refills before next visit -Controlled substance contract 03/09/2016 - Has had UDS with [place] [employer] and work - Diagnosed by Dr. Aurther Loft of pediatrics -[place] reviewed-only Rx through [place]  Wrist pain-started with pain 1.5 years ago worse with turning motion- [facility] and they did MRI and everything was ok- was told ganglion cyst - he does not want to pursue surgery for now- will monitor.    Back overall is much better after working with PT and Dr. Paulla Fore. Does have some neck tension and pain under right shoulder blade- he would like to get back in with PT as found helpful- wants to talk about possible dry needling with Dr. Kayleen Memos. Referred today   6 months. 1 year physical.   Lab/Order associations:fasting other than protein drink sample.   Preventative health care - Plan: CBC, Comprehensive metabolic panel, Lipid panel  Screening for hyperlipidemia - Plan: Lipid panel  High risk medication use - Plan: CBC, Comprehensive metabolic panel  Neck pain - Plan:  Ambulatory referral to Physical Therapy  Meds ordered this encounter  Medications  . amphetamine-dextroamphetamine (ADDERALL XR) 20 MG 24 hr capsule    Sig: Take 1 capsule (20 mg total) by mouth every morning. May fill today    Dispense:  30 capsule    Refill:  0  . amphetamine-dextroamphetamine (ADDERALL XR) 20 MG 24 hr capsule    Sig: Take 1 capsule (20 mg total) by mouth every morning. May fill in 2 months    Dispense:  30 capsule    Refill:  0  . amphetamine-dextroamphetamine (ADDERALL XR) 20 MG 24 hr capsule    Sig: Take 1 capsule (20 mg total) by mouth every morning. May fill in 1 month    Dispense:  30 capsule    Refill:  0    Return precautions advised.   Garret Reddish, MD

## 2019-01-24 ENCOUNTER — Telehealth: Payer: Self-pay | Admitting: Family Medicine

## 2019-01-24 NOTE — Telephone Encounter (Signed)
See note  Copied from CRM #[id number]. Topic: General - Other >> Jan 24, 2019  4:19 PM Mcneil, Ja-Kwan wrote: Reason for CRM: Pt returned call to the office. Pt requests call back.

## 2019-01-25 NOTE — Telephone Encounter (Signed)
Patient has been notified of lab results. See result note.   

## 2019-01-31 ENCOUNTER — Ambulatory Visit: Payer: BLUE CROSS/BLUE SHIELD | Admitting: Physical Therapy

## 2019-02-05 ENCOUNTER — Encounter: Payer: Self-pay | Admitting: Family Medicine

## 2019-02-05 ENCOUNTER — Ambulatory Visit (INDEPENDENT_AMBULATORY_CARE_PROVIDER_SITE_OTHER): Payer: BLUE CROSS/BLUE SHIELD | Admitting: Family Medicine

## 2019-02-05 ENCOUNTER — Other Ambulatory Visit: Payer: Self-pay

## 2019-02-05 VITALS — BP 102/80 | HR 81 | Temp 98.5°F | Ht 70.0 in | Wt 224.0 lb

## 2019-02-05 DIAGNOSIS — R21 Rash and other nonspecific skin eruption: Secondary | ICD-10-CM | POA: Diagnosis not present

## 2019-02-05 MED ORDER — CEPHALEXIN 500 MG PO CAPS: 500.0000 mg | ORAL_CAPSULE | Freq: Two times a day (BID) | ORAL | 0 refills | Status: AC

## 2019-02-05 NOTE — Progress Notes (Signed)
Phone [phone]   Subjective:  Eric Gould is a 23 y.o. year old very pleasant male patient who presents for/with See problem oriented charting ROS-see additional ROS below.  No fever, chills, nausea, vomiting.  Rash as below  Past Medical History-  Patient Active Problem List   Diagnosis Date Noted  . Attention deficit disorder (ADD) in adult 11/06/2013    Priority: High  . Asthma, moderate persistent, well-controlled 11/06/2013    Priority: Medium  . Allergic rhinitis 11/06/2013    Priority: Low  . Leg length inequality 02/18/2018  . Chronic right-sided low back pain without sciatica 02/01/2018    Medications- reviewed and updated Current Outpatient Medications  Medication Sig Dispense Refill  . albuterol (PROVENTIL HFA;VENTOLIN HFA) 108 (90 Base) MCG/ACT inhaler INHALE 2 PUFFS INTO THE LUNGS EVERY 6 HOURS AS NEEDED FOR SHORTNESS OF BREATH 18 Inhaler 4  . amphetamine-dextroamphetamine (ADDERALL XR) 20 MG 24 hr capsule Take 1 capsule (20 mg total) by mouth every morning. May fill today 30 capsule 0  . amphetamine-dextroamphetamine (ADDERALL XR) 20 MG 24 hr capsule Take 1 capsule (20 mg total) by mouth every morning. May fill in 2 months 30 capsule 0  . amphetamine-dextroamphetamine (ADDERALL XR) 20 MG 24 hr capsule Take 1 capsule (20 mg total) by mouth every morning. May fill in 1 month 30 capsule 0  . cephALEXin (KEFLEX) 500 MG capsule Take 1 capsule (500 mg total) by mouth 2 (two) times daily for 7 days. 14 capsule 0   No current facility-administered medications for this visit.      Objective:  BP 102/80 (BP Location: Right Arm, Patient Position: Sitting, Cuff Size: Large)   Pulse 81   Temp 98.5 F (36.9 C) (Oral)   Ht 5\' 10"  (1.778 m)   Wt 224 lb (101.6 kg)   SpO2 97%   BMI 32.14 kg/m  Gen: NAD, resting comfortably CV: RRR  Lungs: nonlabored, normal respiratory rate Abdomen: soft/nondistended  Ext: no edema Skin: warm, dry, multiple erythematous papules on  the arms, chest, back, neckline, hairline-ones in the hairline appear to be improving.  No obvious excoriations. Neuro: Gait and speech normal    Assessment and Plan    Rash S: Patient complains of small bumps on his scalp, shoulders, chest arm, back of neck.  Noted ingrown hair at bottom of penile shaft and that is doing better. He denies any itching.  The areas are somewhat sore to touch. 5 days total- not sure if continuing to worsen.   Potential exposures 1.Used new scented soap though same brand for a week and stopped 3 days ago. Rash started 5 days ago while using the new soap. 2.Got hair cut last week and they washed it- scalp has been getting better. Very sensitive to new shampoos.  Was vaccinated from chicken pox. No close contacts with rash. Shares bed with girlfriend- she has no issues. They washed dog yesterday and no obvious fleas.   ROS-not ill appearing, no fever/chills. No new medications. Not immunocompromised. No mucus membrane involvement.  A/P:  From AVS "  Patient Instructions  Possible folliculitis/hair follicle infection  Treat with antbiotic Keflex twice a day for 7 days. If no better or worsens give [place] a call or send mychart message   Considered bed bugs but since girlfriend is ok that's less likely  Could be irritant from soap- honestly probably the most likely- could use prednisone for this but want to hold off as we arent sure how prednisone does with  coronavirus and with that spreading right now want to avoid if possible.     " Could trial low-dose prednisone as next up depending on data at that time for covid-19 and prednisone as well as current rates in [facility] for covid-19  Future Appointments  Date Time Provider Department Center  08/06/2019  8:00 AM Shelva Majestic, MD [facility]   Lab/Order associations: Rash  Meds ordered this encounter  Medications  . cephALEXin (KEFLEX) 500 MG capsule    Sig: Take 1 capsule (500 mg total) by  mouth 2 (two) times daily for 7 days.    Dispense:  14 capsule    Refill:  0    Return precautions advised.  Tana Conch, MD

## 2019-02-05 NOTE — Patient Instructions (Addendum)
Possible folliculitis/hair follicle infection  Treat with antbiotic Keflex twice a day for 7 days. If no better or worsens give [place] a call or send mychart message   Considered bed bugs but since girlfriend is ok that's less likely  Could be irritant from soap- honestly probably the most likely- could use prednisone for this but want to hold off as we arent sure how prednisone does with coronavirus and with that spreading right now want to avoid if possible.

## 2019-04-13 ENCOUNTER — Telehealth: Payer: Self-pay | Admitting: Family Medicine

## 2019-04-13 NOTE — Telephone Encounter (Signed)
See request °

## 2019-04-13 NOTE — Telephone Encounter (Signed)
Copied from CRM [id number]. Topic: Quick Communication - Rx Refill/Question >> Apr 13, 2019  9:25 AM Jaquita Rector A wrote: Medication: amphetamine-dextroamphetamine (ADDERALL XR) 20 MG 24 hr capsule  Has the patient contacted their pharmacy? Yes.   (Agent: If no, request that the patient contact the pharmacy for the refill.) (Agent: If yes, when and what did the pharmacy advise?)  Preferred Pharmacy (with phone number or street name): [facility] [place], [place] - [address] [phone] (Phone) [phone] (Fax)    Agent: Please be advised that RX refills may take up to 3 business days. We ask that you follow-up with your pharmacy.

## 2019-04-13 NOTE — Telephone Encounter (Signed)
Per controlled substance database, this was filled on 04/10/2019.

## 2019-05-02 ENCOUNTER — Ambulatory Visit (INDEPENDENT_AMBULATORY_CARE_PROVIDER_SITE_OTHER): Payer: BC Managed Care – PPO | Admitting: Family Medicine

## 2019-05-02 ENCOUNTER — Encounter: Payer: Self-pay | Admitting: Family Medicine

## 2019-05-02 ENCOUNTER — Telehealth: Payer: Self-pay | Admitting: Family Medicine

## 2019-05-02 VITALS — HR 92 | Temp 98.0°F | Ht 70.0 in | Wt 216.0 lb

## 2019-05-02 DIAGNOSIS — F988 Other specified behavioral and emotional disorders with onset usually occurring in childhood and adolescence: Secondary | ICD-10-CM | POA: Diagnosis not present

## 2019-05-02 DIAGNOSIS — J4521 Mild intermittent asthma with (acute) exacerbation: Secondary | ICD-10-CM

## 2019-05-02 DIAGNOSIS — J3089 Other allergic rhinitis: Secondary | ICD-10-CM

## 2019-05-02 MED ORDER — PREDNISONE 20 MG PO TABS: ORAL_TABLET | ORAL | 0 refills | Status: DC

## 2019-05-02 MED ORDER — AMPHETAMINE-DEXTROAMPHET ER 20 MG PO CP24: 20.0000 mg | ORAL_CAPSULE | ORAL | 0 refills | Status: DC

## 2019-05-02 MED ORDER — ALBUTEROL SULFATE HFA 108 (90 BASE) MCG/ACT IN AERS: INHALATION_SPRAY | RESPIRATORY_TRACT | 4 refills | Status: DC

## 2019-05-02 NOTE — Progress Notes (Signed)
Phone [phone]   Subjective:  Virtual visit via Video note. Chief complaint: Chief Complaint  Patient presents with  . Asthma    This visit type was conducted due to national recommendations for restrictions regarding the COVID-19 Pandemic (e.g. social distancing).  This format is felt to be most appropriate for this patient at this time balancing risks to patient and risks to population by having him in for in person visit.  No physical exam was performed (except for noted visual exam or audio findings with Telehealth visits).    Our team/I connected with Eric Gould at  9:20 AM EDT by a video enabled telemedicine application (doxy.me or caregility through epic) and verified that I am speaking with the correct person using two identifiers.  Location patient: [facility] Location provider: [facility], office Persons participating in the virtual visit:  patient  Our team/I discussed the limitations of evaluation and management by telemedicine and the availability of in person appointments. In light of current covid-19 pandemic, patient also understands that we are trying to protect them by minimizing in office contact if at all possible.  The patient expressed consent for telemedicine visit and agreed to proceed. Patient understands insurance will be billed.   ROS- No fever, chills,  body aches, sore throat, or loss of taste or smell. Some stuffy nose, cough, shortness of breath,   Past Medical History-  Patient Active Problem List   Diagnosis Date Noted  . Attention deficit disorder (ADD) in adult 11/06/2013    Priority: High  . Asthma, moderate persistent, well-controlled 11/06/2013    Priority: Medium  . Allergic rhinitis 11/06/2013    Priority: Low  . Leg length inequality 02/18/2018  . Chronic right-sided low back pain without sciatica 02/01/2018    Medications- reviewed and updated Current Outpatient Medications  Medication Sig Dispense Refill  . albuterol (VENTOLIN HFA)  108 (90 Base) MCG/ACT inhaler INHALE 2 PUFFS INTO THE LUNGS EVERY 6 HOURS AS NEEDED FOR SHORTNESS OF BREATH 18 Inhaler 4  . amphetamine-dextroamphetamine (ADDERALL XR) 20 MG 24 hr capsule Take 1 capsule (20 mg total) by mouth every morning. May fill today 30 capsule 0  . fexofenadine (ALLEGRA) 180 MG tablet Take 180 mg by mouth daily.    Marland Kitchen amphetamine-dextroamphetamine (ADDERALL XR) 20 MG 24 hr capsule Take 1 capsule (20 mg total) by mouth every morning. May fill in 2 months 30 capsule 0  . amphetamine-dextroamphetamine (ADDERALL XR) 20 MG 24 hr capsule Take 1 capsule (20 mg total) by mouth every morning. May fill in 1 month 30 capsule 0  . predniSONE (DELTASONE) 20 MG tablet Take 2 pills for 3 days, 1 pill for 4 days 10 tablet 0   No current facility-administered medications for this visit.      Objective:  Pulse 92   Temp 98 F (36.7 C) (Oral)   Ht 5\' 10"  (1.778 m)   Wt 216 lb (98 kg)   BMI 30.99 kg/m  self reported vitals Gen: NAD, resting comfortably Lungs: nonlabored, normal respiratory rate at present Skin: appears dry, no obvious rash     Assessment and Plan   # Asthma S: Thursday of last week he moved into a new house with his GF. 5-6 days of symptoms. They were doing a lot of construction in garage and had not been opened up in 3-4 months. They blew it all out and swept. Tried to wear a mask as he has had issues in the past with dust.   Mainly  afternoons, mornings, evenings. Having to use his albuterol 4x a day- getting good relief when he uses hsi albuterol. Last night had a mild attack- called nursing care line - got coughing fit after drinking some water- had to use it about 30 minutes apart. He is wheezing with it and occasional chest tightness.  Started allegra last night.  A/P: asthma flare- treat with course of prednisone. GIven trigger of dust/working in garage strongly doubt covid 19. He is able to self isolate.   Therefore: - recommended patient watch closely for  shortness of breath or confusion or worsening symptoms and if those occur he should contact us immediately -recommended self isolation for 10 days PLUS symptom free for at least 3 days  - refilled albuterol -advised continue allegra in evenings for now as allergens with dust likely a trigger  # ADD S:good control on adderall 20mg  XR.   A/P: stable. Continue current meds.   -refill ADD meds for 3 months -does not need visit until December -controleld substance contract 03/09/16 - UDS with [name] (dont feel strongly about bringing him in for this right now and particularly with new cough) - diagnosed by [name] of peds -[facility] reviewed- only rx through us  #social update- still out of work for now- hoping back by July. Still trying to get into [name] Future Appointments  Date Time Provider Department Center  08/06/2019  8:00 AM Shelva MajesticHunter, Stephen O, MD [facility]   Lab/Order associations: Mild intermittent asthma with exacerbation  Non-seasonal allergic rhinitis, unspecified trigger  Attention deficit disorder (ADD) in adult  Meds ordered this encounter  Medications  . predniSONE (DELTASONE) 20 MG tablet    Sig: Take 2 pills for 3 days, 1 pill for 4 days    Dispense:  10 tablet    Refill:  0  . amphetamine-dextroamphetamine (ADDERALL XR) 20 MG 24 hr capsule    Sig: Take 1 capsule (20 mg total) by mouth every morning. May fill in 2 months    Dispense:  30 capsule    Refill:  0  . amphetamine-dextroamphetamine (ADDERALL XR) 20 MG 24 hr capsule    Sig: Take 1 capsule (20 mg total) by mouth every morning. May fill today    Dispense:  30 capsule    Refill:  0  . amphetamine-dextroamphetamine (ADDERALL XR) 20 MG 24 hr capsule    Sig: Take 1 capsule (20 mg total) by mouth every morning. May fill in 1 month    Dispense:  30 capsule    Refill:  0  . albuterol (VENTOLIN HFA) 108 (90 Base) MCG/ACT inhaler    Sig: INHALE 2 PUFFS INTO THE LUNGS EVERY 6 HOURS AS NEEDED FOR  SHORTNESS OF BREATH    Dispense:  18 Inhaler    Refill:  4    Return precautions advised.  Tana ConchStephen [name], MD

## 2019-05-02 NOTE — Patient Instructions (Signed)
There are no preventive care reminders to display for this patient.  Depression screen [facility] 2/9 10/17/2018 11/25/2017  Decreased Interest 0 0  Down, Depressed, Hopeless 0 0  PHQ - 2 Score 0 0

## 2019-05-02 NOTE — Telephone Encounter (Signed)
[place] [facility] [place] RECORD [facility]urse Patient Name: Eric Gould Gender: Male DOB: 07-13-1996 Age: 23 Y 1 M 4 D Return Phone Number: [phone] (Primary) Address: City/State/Zip: [place] [address] Client [place] [facility] [place] Client Site [place] [facility] [place] Night Physician Garret Reddish- MD Contact Type Call Who Is Calling Patient / Member / Family / Caregiver Call Type Triage / Clinical Caller Name Montee Tallman Relationship To Patient Mother Return Phone Number [phone] (Primary) Chief Complaint BREATHING - shortness of breath or sounds breathless Reason for Call Symptomatic / Request for [place] states that her son has asthma and the last few days he has been having problems. He has been using his rescue inhaler a lot and he is short of breath. He wants to know if he can use his inhaler again if he just used it at 7:30. Translation No Nurse Assessment Nurse: Ottis Stain, RN, Sherrie Date/Time (Eastern Time): 05/01/2019 8:12:35 PM Confirm and document reason for call. If symptomatic, describe symptoms. ---Caller states son has asthma. She is not with him. Called the girlfriend who is with the son. Has been having to use the albuterol inhaler more frequently today. Moved into new house and cleaning out a garage. The last 20 minutes has been having a difficult time breathing after a coughing episode. The coughing episode started after using inhaler @ 7:30 pm. Has the patient had close contact with a person known or suspected to have the novel coronavirus illness OR traveled / lives in area with major community spread (including international travel) in the last 14 days from the onset of symptoms? * If Asymptomatic, screen for exposure and travel within the last 14 days. ---Not Applicable Does the patient have any new or worsening symptoms?  ---Yes Will a triage be completed? ---Yes Related visit to physician within the last 2 weeks? ---N/A Does the PT have any chronic conditions? (i.e. diabetes, asthma, this includes High risk factors for pregnancy, etc.) ---Yes List chronic conditions. ---Asthma Is this a behavioral health or substance abuse call? ---No Nurse: Ottis Stain, RN, Sherrie Date/Time (Eastern Time): 05/01/2019 9:15:57 PM PLEASE NOTE: All timestamps contained within this report are represented as [place] Standard Time. CONFIDENTIALTY NOTICE: This fax transmission is intended only for the addressee. It contains information that is legally privileged, confidential or otherwise protected from use or disclosure. If you are not the intended recipient, you are strictly prohibited from reviewing, disclosing, copying using or disseminating any of this information or taking any action in reliance on or regarding this information. If you have received this fax in error, please notify us immediately by telephone so that we can arrange for its return to [place]. Phone: [phone], Toll-Free: [phone], Fax: [phone] Page: 2 of 3 Call Id: [id number] Nurse Assessment Confirm and document reason for call. If symptomatic, describe symptoms. ---Caller states feeling much better. Can talk without difficulty. No chest tightness, No further coughing. Says chest feels hazy. Says maybe some slight wheezing in back of throat. Has the patient had close contact with a person known or suspected to have the novel coronavirus illness OR traveled / lives in area with major community spread (including international travel) in the last 14 days from the onset of symptoms? * If Asymptomatic, screen for exposure and travel within the last 14 days. ---Not Applicable Does the patient have any new or worsening symptoms? ---Yes Will a triage be completed? ---Yes Related  visit to physician within the last 2 weeks? ---N/A Does the PT have any chronic  conditions? (i.e. diabetes, asthma, this includes High risk factors for pregnancy, etc.) ---Yes List chronic conditions. ---Asthma Is this a behavioral health or substance abuse call? ---No Guidelines Guideline Title Affirmed Question Affirmed Notes Nurse Date/Time (Eastern Time) Asthma Attack [1] Wheezing or coughing AND [2] hasn't used neb or inhaler twice AND [3] it's available Nunzio CoryLyons, Charity fundraiserN, Sherrie 05/01/2019 8:20:20 PM Asthma Attack MILD asthma attack (e.g., no SOB at rest, mild SOB with walking, speaks normally in sentences, mild wheezing) Nunzio CoryLyons, RN, Sherrie 05/01/2019 9:18:01 PM Disp. Time Lamount Cohen(Eastern Time) Disposition Final User 05/01/2019 8:09:21 PM Send to Urgent Leda QuailQueue Fuller, Austyn 05/01/2019 8:25:47 PM Urgent Home Treatment with Follow-Up Call [place], RN, Sherrie 05/01/2019 8:26:26 PM Send To RN Personal Nunzio CoryLyons, RN, Sherrie 05/01/2019 9:25:17 PM Home Care Yes Nunzio CoryLyons, RN, Sherrie Caller Disagree/Comply Comply Caller Understands Yes PreDisposition Go to ED Care Advice Given Per Guideline URGENT HOME TREATMENT WITH FOLLOW-UP CALL: * CALL CENTER PROVIDES RN CALL-BACKS: You should usually improve with the home treatment advice I give you. I'll call you back in 30-60 minutes to see how you are doing. Call PLEASE NOTE: All timestamps contained within this report are represented as [place] Standard Time. CONFIDENTIALTY NOTICE: This fax transmission is intended only for the addressee. It contains information that is legally privileged, confidential or otherwise protected from use or disclosure. If you are not the intended recipient, you are strictly prohibited from reviewing, disclosing, copying using or disseminating any of this information or taking any action in reliance on or regarding this information. If you have received this fax in error, please notify us immediately by telephone so that we can arrange for its return to us. Phone: [phone], Toll-Free: [phone], Fax:  [phone] Page: 3 of 3 Call Id: [id number] Care Advice Given Per Guideline me back immediately if: you become worse before my follow-up call. ASTHMA QUICK-RELIEF MEDICINE (e.g., albuterol, salbutamol, Xopenex): * Give yourself a nebulizer or inhaler (4 puffs) treatment using your quick-relief medicine (e.g., albuterol) right now. CALL BACK IF: * You become worse before RN follow-up call TRIAGER F/U CALL: In 30 to 60 minutes, RN calls the adult back. Evaluate response to neb or inhaler: HOME CARE: * You should be able to treat this at home. REASSURANCE AND EDUCATION: It sounds like a mild asthma attack that we can treat at home. GENERAL ASTHMA EDUCATION: There is no cure for asthma but you can take charge and learn to control it. The best way to take charge of asthma is to work with your doctor over many months to find the right controller (preventive) medicine so that your asthma is under control. If you keep having asthma attacks, then the asthma is not under control. People can die from asthma if they do not take it seriously and work with a doctor to control it. DRINKING FLUIDS AND USING A HUMIDIFIER: * Drink a normal amount of liquids (e.g., water). Being adequately hydrated makes it easier to cough up the sticky lung mucus. * If the air is dry, use a humidifier to prevent drying of the upper airway. CALL BACK IF: * Wheezing is not improved after neb or inhaler * Inhaled asthma medicine (neb or MDI) is needed more often than every 4 hours

## 2019-05-02 NOTE — Telephone Encounter (Signed)
Patient on schedule for Doxy.me today with Dr. Yong Channel.

## 2019-07-09 ENCOUNTER — Telehealth: Payer: Self-pay | Admitting: Family Medicine

## 2019-07-09 NOTE — Telephone Encounter (Signed)
immunization report printed for pt to pick up. Pt was notified to stop by anytime this week. Pt denies Covid 19 contact and Sx. Pt aware to wear a mask.

## 2019-07-09 NOTE — Telephone Encounter (Signed)
Patient is calling to request his immunization record for employment.   Patient would like to you be notified when he can pick it up. Please advise CB- [phone]

## 2019-08-06 ENCOUNTER — Ambulatory Visit (INDEPENDENT_AMBULATORY_CARE_PROVIDER_SITE_OTHER): Payer: BC Managed Care – PPO | Admitting: Family Medicine

## 2019-08-06 ENCOUNTER — Other Ambulatory Visit: Payer: Self-pay

## 2019-08-06 ENCOUNTER — Encounter: Payer: Self-pay | Admitting: Family Medicine

## 2019-08-06 VITALS — BP 122/84 | HR 62 | Temp 99.3°F | Ht 70.0 in | Wt 214.6 lb

## 2019-08-06 DIAGNOSIS — J3089 Other allergic rhinitis: Secondary | ICD-10-CM

## 2019-08-06 DIAGNOSIS — F988 Other specified behavioral and emotional disorders with onset usually occurring in childhood and adolescence: Secondary | ICD-10-CM | POA: Diagnosis not present

## 2019-08-06 DIAGNOSIS — J4530 Mild persistent asthma, uncomplicated: Secondary | ICD-10-CM

## 2019-08-06 DIAGNOSIS — B079 Viral wart, unspecified: Secondary | ICD-10-CM

## 2019-08-06 DIAGNOSIS — Z23 Encounter for immunization: Secondary | ICD-10-CM

## 2019-08-06 MED ORDER — AMPHETAMINE-DEXTROAMPHET ER 20 MG PO CP24: 20.0000 mg | ORAL_CAPSULE | ORAL | 0 refills | Status: DC

## 2019-08-06 NOTE — Addendum Note (Signed)
Addended by: Jasper Loser on: 08/06/2019 09:03 AM   Modules accepted: Orders

## 2019-08-06 NOTE — Progress Notes (Signed)
Phone [phone]   Subjective:  Eric Gould is a 23 y.o. year old very pleasant male patient who presents for/with See problem oriented charting Chief Complaint  Patient presents with  . Follow-up  . Allergic Rhinitis   . Asthma  . ADHD   ROS- No chest pain or shortness of breath. No headache or blurry vision.  No unintentional weight loss   Past Medical History-  Patient Active Problem List   Diagnosis Date Noted  . Attention deficit disorder (ADD) in adult 11/06/2013    Priority: High  . Asthma, mild persistent 11/06/2013    Priority: Medium  . Allergic rhinitis 11/06/2013    Priority: Low  . Leg length inequality 02/18/2018  . Chronic right-sided low back pain without sciatica 02/01/2018    Medications- reviewed and updated Current Outpatient Medications  Medication Sig Dispense Refill  . albuterol (VENTOLIN HFA) 108 (90 Base) MCG/ACT inhaler INHALE 2 PUFFS INTO THE LUNGS EVERY 6 HOURS AS NEEDED FOR SHORTNESS OF BREATH 18 Inhaler 4  . amphetamine-dextroamphetamine (ADDERALL XR) 20 MG 24 hr capsule Take 1 capsule (20 mg total) by mouth every morning. 30 capsule 0  . [START ON 09/05/2019] amphetamine-dextroamphetamine (ADDERALL XR) 20 MG 24 hr capsule Take 1 capsule (20 mg total) by mouth every morning. 30 capsule 0  . [START ON 10/06/2019] amphetamine-dextroamphetamine (ADDERALL XR) 20 MG 24 hr capsule Take 1 capsule (20 mg total) by mouth every morning. 30 capsule 0  . fexofenadine (ALLEGRA) 180 MG tablet Take 180 mg by mouth daily.     No current facility-administered medications for this visit.      Objective:  BP 122/84 (BP Location: Left Arm, Patient Position: Sitting, Cuff Size: Large)   Pulse 62   Temp 99.3 F (37.4 C) (Temporal)   Ht 5\' 10"  (1.778 m)   Wt 214 lb 9.6 oz (97.3 kg)   SpO2 97%   BMI 30.79 kg/m  Gen: NAD, resting comfortably CV: RRR no murmurs rubs or gallops Lungs: CTAB no crackles, wheeze, rhonchi Ext: no edema Skin: warm, dry   Procedure note: Benefits and risks verbally discussed with patient 10 second freeze thaw cycle of cryotherapy performed  with liquid nitrogen to left shin wart and right wrist wart No complications.  Patient tolerated the procedure well other than mild pain. Gave handout on this from [facility] and we reviewed this content thoroughly [email or web address]    Assessment and Plan   # Asthma # Allergic Rhinitis S:Using Albuterol HFA prn.   About once a month Allergies at times can trigger asthma-takes Allegra regularly A/P: both well controlled- continue current rx.    # ADD S: Compliant with Adderall 20 mg extended release.  Well-controlled  - Controlled substance contract signed March 09, 2016 - Regular urine drug screens with [place] [facility].  - Diagnosis by Dr. Rosaria Ferries pediatrics A/P: well controlled- continue current meds   -[facility] reviewed -only Rx through [place]  # Warts S:noted on right wrist about a year ago and then later on left shin. Has tried home freeze therapy but without relief. Left one gets irritated with deadlifts. No regular pain unless cut.   A/P: warts x2- discussed risk of recurrence. will treat both with cryotherapy today.   Recommended follow up: 6 month physical  Lab/Order associations:   ICD-10-CM   1. Attention deficit disorder (ADD) in adult  F98.8   2. Mild persistent asthma without complication  I43.32   3. Viral warts, unspecified type  B07.9   4.  Non-seasonal allergic rhinitis, unspecified trigger  J30.89     Meds ordered this encounter  Medications  . amphetamine-dextroamphetamine (ADDERALL XR) 20 MG 24 hr capsule    Sig: Take 1 capsule (20 mg total) by mouth every morning.    Dispense:  30 capsule    Refill:  0  . amphetamine-dextroamphetamine (ADDERALL XR) 20 MG 24 hr capsule    Sig: Take 1 capsule (20 mg total) by mouth every morning.    Dispense:  30 capsule    Refill:  0  .  amphetamine-dextroamphetamine (ADDERALL XR) 20 MG 24 hr capsule    Sig: Take 1 capsule (20 mg total) by mouth every morning.    Dispense:  30 capsule    Refill:  0    Return precautions advised.  Tana ConchStephen [name], MD

## 2019-08-06 NOTE — Patient Instructions (Addendum)
Health Maintenance Due  Topic Date Due  . INFLUENZA VACCINE - today 06/23/2019   Retreat warts if recurrent once they heal over from this freeze. Usually know within 3-4 weeks  We can refill your Adderall in 3 months by MyChart or phone and then see [place] in 6 months for your yearly physical

## 2019-10-04 ENCOUNTER — Encounter: Payer: Self-pay | Admitting: Family Medicine

## 2019-10-04 ENCOUNTER — Other Ambulatory Visit: Payer: Self-pay

## 2019-10-04 ENCOUNTER — Ambulatory Visit (INDEPENDENT_AMBULATORY_CARE_PROVIDER_SITE_OTHER): Payer: BC Managed Care – PPO | Admitting: Family Medicine

## 2019-10-04 VITALS — BP 120/62 | Temp 98.6°F | Ht 70.0 in | Wt 214.0 lb

## 2019-10-04 DIAGNOSIS — F988 Other specified behavioral and emotional disorders with onset usually occurring in childhood and adolescence: Secondary | ICD-10-CM | POA: Diagnosis not present

## 2019-10-04 DIAGNOSIS — J3089 Other allergic rhinitis: Secondary | ICD-10-CM

## 2019-10-04 DIAGNOSIS — J4540 Moderate persistent asthma, uncomplicated: Secondary | ICD-10-CM

## 2019-10-04 DIAGNOSIS — B079 Viral wart, unspecified: Secondary | ICD-10-CM

## 2019-10-04 MED ORDER — AMPHETAMINE-DEXTROAMPHET ER 20 MG PO CP24: 20.0000 mg | ORAL_CAPSULE | ORAL | 0 refills | Status: DC

## 2019-10-04 MED ORDER — QVAR 40 MCG/ACT IN AERS: INHALATION_SPRAY | RESPIRATORY_TRACT | 11 refills | Status: DC

## 2019-10-04 NOTE — Progress Notes (Signed)
Phone [phone] In person visit   Subjective:   Eric Gould is a 23 y.o. year old very pleasant male patient who presents for/with See problem oriented charting Chief Complaint  Patient presents with  . Medication Management   ROS- Review of Systems  Constitutional: Negative.   HENT: Negative.   Eyes: Negative.   Respiratory: Positive for wheezing.        Increased issues with asthma   Cardiovascular: Negative.   Gastrointestinal: Negative.   Genitourinary: Negative.   Musculoskeletal: Negative.   Skin: Negative.   Neurological: Negative.   Endo/Heme/Allergies: Negative.   Psychiatric/Behavioral: Negative.    Past Medical History-  Patient Active Problem List   Diagnosis Date Noted  . Attention deficit disorder (ADD) in adult 11/06/2013    Priority: High  . Asthma, moderate persistent 11/06/2013    Priority: Medium  . Allergic rhinitis 11/06/2013    Priority: Low  . Leg length inequality 02/18/2018  . Chronic right-sided low back pain without sciatica 02/01/2018    Medications- reviewed and updated Current Outpatient Medications  Medication Sig Dispense Refill  . albuterol (VENTOLIN HFA) 108 (90 Base) MCG/ACT inhaler INHALE 2 PUFFS INTO THE LUNGS EVERY 6 HOURS AS NEEDED FOR SHORTNESS OF BREATH 18 Inhaler 4  . [START ON 12/05/2019] amphetamine-dextroamphetamine (ADDERALL XR) 20 MG 24 hr capsule Take 1 capsule (20 mg total) by mouth every morning. 30 capsule 0  . fexofenadine (ALLEGRA) 180 MG tablet Take 180 mg by mouth daily.    Derrill Memo ON 10/06/2019] amphetamine-dextroamphetamine (ADDERALL XR) 20 MG 24 hr capsule Take 1 capsule (20 mg total) by mouth every morning. 30 capsule 0  . [START ON 11/05/2019] amphetamine-dextroamphetamine (ADDERALL XR) 20 MG 24 hr capsule Take 1 capsule (20 mg total) by mouth every morning. 30 capsule 0  . beclomethasone (QVAR) 40 MCG/ACT inhaler INHALE 2 PUFFS INTO THE LUNGS 2 (TWO) TIMES DAILY. 26.1 g 11   No current  facility-administered medications for this visit.      Objective:  BP 120/62   Temp 98.6 F (37 C) (Temporal)   Ht 5\' 10"  (1.778 m)   Wt 214 lb (97.1 kg)   BMI 30.71 kg/m  Gen: NAD, resting comfortably  CV: RRR  Lungs: nonlabored, normal respiratory rate Abdomen: soft/nondistended  Ext: no edema Skin: warm, dry, see wart exam and problem oriented charting  Procedure note: Benefits and risks verbally discussed with patient 10 second freeze thaw cycle of cryotherapy performed  with liquid nitrogen to right wrist, 15 seconds to left shin No complications.  Patient tolerated the procedure well other than mild pain. Gave handout on this from [facility] and we reviewed this content thoroughly [email or web address]    Assessment and Plan   # Asthma # Allergic Rhinitis S:Using Albuterol HFA prn.  Patient has had increased wheezing. He denies any medications changes. He has had changes in his environment.  Allergies at times can trigger asthma-takes Allegra regularly and consistently. No known covid exposures   For last 2 weeks needing albuterol either every other day or every day. More problems since moving into new home but has not found a clear trigger.  A/P: poor contorl since moving into new home. Allergies seem reasonably controlled which has been trigger in the past-continue Allegra. Had been on albuterol through early 2019- with worsening symptoms and daily or every other day use of albuterol- opted to restart qvar and if not improving within 2-3 weeks or worsens he will let us know.     #  ADD S: Compliant with Adderall 20 mg extended release.  Well-controlled  - Controlled substance contract signed March 09, 2016 - Regular urine drug screens with [place] [facility].  - Diagnosis by Dr. Reginia Forts pediatrics  Since the last visit has the patient had any: Appetite changes? No Unintentional weight loss? No Is medication  working well ? Yes Does patient take drug holidays? No Difficulties falling to sleep or maintaining sleep? No Any anxiety?  No Any cardiac issues (fainting or paliptations)? No Suicidal thoughts? No Changes in health since last visit? No New medications? No Any illicit substance abuse? No Has the patient taken his medication today? Yes A/P: well controlled- gave additional 2 refills. Due to updated visit today- can give another 3 months if needed and see 5-6 months from now  -NCCS[facility] reviewed -only Rx through [place]  # Warts S: Cryotherapy completed to left shin as well as right wrist on August 06, 2019 visit.  Previously had tried home freeze therapy without relief.  The one on his left leg continues to get irritated by deadlifts. O: 3 to 4 x 3 to 4 mm verrucous lesion on right wrist.  1 x 1 cm verrucous lesion on left shin-some scaling on top.  Pared down both lesions before cryotherapy-lesion on left leg with some bleeding. A/P: warts x2- discussed risk of recurrence. will treat both with cryotherapy today.   Recommended follow up: Plan follow-up in March-for physical.  May need to see each other sooner for repeat wart treatment or if asthma fails to improve Future Appointments  Date Time Provider Department Center  02/04/2020  8:00 AM Shelva Majestic, MD [facility]   Lab/Order associations:   ICD-10-CM   1. Moderate persistent asthma without complication  J45.40   2. Attention deficit disorder (ADD) in adult  F98.8   3. Viral warts, unspecified type  B07.9    Meds ordered this encounter  Medications  . beclomethasone (QVAR) 40 MCG/ACT inhaler    Sig: INHALE 2 PUFFS INTO THE LUNGS 2 (TWO) TIMES DAILY.    Dispense:  26.1 g    Refill:  11  . amphetamine-dextroamphetamine (ADDERALL XR) 20 MG 24 hr capsule    Sig: Take 1 capsule (20 mg total) by mouth every morning.    Dispense:  30 capsule    Refill:  0  . amphetamine-dextroamphetamine (ADDERALL XR) 20 MG 24 hr capsule     Sig: Take 1 capsule (20 mg total) by mouth every morning.    Dispense:  30 capsule    Refill:  0   Return precautions advised.  Tana Conch, MD

## 2019-10-04 NOTE — Patient Instructions (Addendum)
Restart qvar  If warts come back anytime in next 4-8 weeks- we can try another freeze. The one on the leg I'm leaning toward derm opinion if doesn't have good response in next time or 2

## 2019-10-04 NOTE — Assessment & Plan Note (Signed)
#   Asthma # Allergic Rhinitis S:Using Albuterol HFA prn.  Patient has had increased wheezing. He denies any medications changes. He has had changes in his environment.  Allergies at times can trigger asthma-takes Allegra regularly and consistently. No known covid exposures.    For last 2 weeks needing albuterol either every other day or every day. More problems since moving into new home but has not found a clear trigger.  A/P: poor contorl since moving into new home. Allergies seem reasonably controlled which has been trigger in the past. Had been on albuterol through early 2019- with worsening symptoms and daily or every other day use of albuterol- opted to restart qvar and if not improving within 2-3 weeks or worsens he will let us know.

## 2019-11-04 DIAGNOSIS — U071 COVID-19: Secondary | ICD-10-CM | POA: Diagnosis not present

## 2019-11-04 DIAGNOSIS — R0981 Nasal congestion: Secondary | ICD-10-CM | POA: Diagnosis not present

## 2019-11-04 DIAGNOSIS — R5383 Other fatigue: Secondary | ICD-10-CM | POA: Diagnosis not present

## 2019-11-04 DIAGNOSIS — R110 Nausea: Secondary | ICD-10-CM | POA: Diagnosis not present

## 2019-12-03 ENCOUNTER — Encounter: Payer: Self-pay | Admitting: Family Medicine

## 2019-12-05 NOTE — Telephone Encounter (Signed)
Pt called following up on message sent about documentation being filled out. Please advise.

## 2019-12-07 NOTE — Telephone Encounter (Signed)
Scheduled for 12/11/2019 @ 1:40 pm

## 2019-12-11 ENCOUNTER — Ambulatory Visit (INDEPENDENT_AMBULATORY_CARE_PROVIDER_SITE_OTHER): Payer: BC Managed Care – PPO | Admitting: Family Medicine

## 2019-12-11 ENCOUNTER — Encounter: Payer: Self-pay | Admitting: Family Medicine

## 2019-12-11 VITALS — Ht 70.0 in | Wt 214.0 lb

## 2019-12-11 DIAGNOSIS — F988 Other specified behavioral and emotional disorders with onset usually occurring in childhood and adolescence: Secondary | ICD-10-CM | POA: Diagnosis not present

## 2019-12-11 DIAGNOSIS — J4540 Moderate persistent asthma, uncomplicated: Secondary | ICD-10-CM

## 2019-12-11 MED ORDER — AMPHETAMINE-DEXTROAMPHET ER 20 MG PO CP24: 20.0000 mg | ORAL_CAPSULE | Freq: Every day | ORAL | 0 refills | Status: DC

## 2019-12-11 NOTE — Progress Notes (Signed)
Phone [phone] Virtual visit via Video note   Subjective:  Chief complaint: ADD and asthma  This visit type was conducted due to national recommendations for restrictions regarding the COVID-19 Pandemic (e.g. social distancing).  This format is felt to be most appropriate for this patient at this time balancing risks to patient and risks to population by having him in for in person visit.  No physical exam was performed (except for noted visual exam or audio findings with Telehealth visits).    Our team/I connected with Eric Gould at  1:40 PM EST by a video enabled telemedicine application ([email or web address] or caregility through epic) and verified that I am speaking with the correct person using two identifiers.  Location patient: [facility] Location provider: [facility], office Persons participating in the virtual visit:  patient  Our team/I discussed the limitations of evaluation and management by telemedicine and the availability of in person appointments. In light of current covid-19 pandemic, patient also understands that we are trying to protect them by minimizing in office contact if at all possible.  The patient expressed consent for telemedicine visit and agreed to proceed. Patient understands insurance will be billed.   Past Medical History-  Patient Active Problem List   Diagnosis Date Noted  . Attention deficit disorder (ADD) in adult 11/06/2013    Priority: High  . Asthma, moderate persistent 11/06/2013    Priority: Medium  . Allergic rhinitis 11/06/2013    Priority: Low    Medications- reviewed and updated Current Outpatient Medications  Medication Sig Dispense Refill  . albuterol (VENTOLIN HFA) 108 (90 Base) MCG/ACT inhaler INHALE 2 PUFFS INTO THE LUNGS EVERY 6 HOURS AS NEEDED FOR SHORTNESS OF BREATH 18 Inhaler 4  . amphetamine-dextroamphetamine (ADDERALL XR) 20 MG 24 hr capsule Take 1 capsule (20 mg total) by mouth every morning. 30 capsule 0  . beclomethasone (QVAR) 40  MCG/ACT inhaler INHALE 2 PUFFS INTO THE LUNGS 2 (TWO) TIMES DAILY. 26.1 g 11  . fexofenadine (ALLEGRA) 180 MG tablet Take 180 mg by mouth daily.       Objective:  Ht 5\' 10"  (1.778 m)   Wt 214 lb (97.1 kg)   BMI 30.71 kg/m  self reported vitals Gen: NAD, resting comfortably Lungs: nonlabored, normal respiratory rate  Skin: appears dry, no obvious rash    Assessment and Plan    # ADD  S:  He was diagnosed by Dr. of pediatrics in elementary school with Attention deficit disorder. Patient had behavioral interventions attempted with his pediatrician without avail.   His first treatment with stimulant dates back to 6th grade. In recent memory, Patient has been on adderall 20mg  XR.Dario Guardian  He did take a one year period off of this in much of 2014 due to weight loss issues (was trying to gain more weight for sports) but did not tolerate being off the medicine and this was restarted.   Patient currently taking Adderall xr 20mg  daily.  No issues. He will occasionally take days off if has less to do that day and notes- struggles with focus with reading or other more focus oriented tasks if not taking medicine. He reports good control of inattention issues when on the medicine. Marland Kitchen He does not report hyperactivity on or off medication- inattention has been his main issue. Patient denies any daytime drowsiness related to the medication.   Since the last visit has the patient had any:  Appetite changes? No Unintentional weight loss? No Is medication working well ? Yes  Does patient take drug holidays? Rarely will skip day as above Difficulties falling to sleep or maintaining sleep? No Any anxiety?  No Any cardiac issues (fainting or paliptations)? No Suicidal thoughts? No Changes in health since last visit? No New medications? No Any illicit substance abuse? No Has the patient taken his medication today? No, ran out of dose on Sunday and needs refill A/P: Adult add with good control on adderall  20mg  XR. Patient has no limitations or contraindications for vigorous intensity physical exercise or high intensity mental stress related to the underlying medical condition (ADD) or use of medicine (Adderall 20mg  XR). We will be submitting this note to [place] [facility] for further review  -For ADD medicines we can do a 103-month refill via MyChart and then a 48-month in-person or virtual visit for refills  # Asthma  S:Currently taking Qvar  2 puffs twice daily with albuterol as needed. He has not needed albuterol in over a month. He would like to know if anything cheaper for maintenane  inhaler is over $100 a month for Qvar.  A/P: Asthma with good control.  We looked through good Rx and try to see if there were any other options that would be more cost effective but unfortunately most inhalers for control were over $200 on good Rx.  We discussed since he has had excellent control trying Qvar 2 puffs in the morning and seeing if that controls his symptoms-if he sees he needs his albuterol more can go back up to 2 puffs twice a day for short periods perhaps a week or 2.  Spacing this out may save him some money and is worth a trial as long as asthma remains well controlled.  Recommended follow up: move below visit out 6 months for CPE. I cancelled [place] visit and he will call to schedule 6 month physical.  Future Appointments  Date Time Provider [place]  02/04/2020  8:00 AM Marin Olp, MD [facility]   Lab/Order associations:   ICD-10-CM   1. Attention deficit disorder (ADD) in adult  F98.8   2. Moderate persistent asthma without complication  Q68.34     Meds ordered this encounter  Medications  . amphetamine-dextroamphetamine (ADDERALL XR) 20 MG 24 hr capsule    Sig: Take 1 capsule (20 mg total) by mouth daily.    Dispense:  30 capsule    Refill:  0  . amphetamine-dextroamphetamine (ADDERALL XR) 20 MG 24 hr capsule    Sig: Take 1 capsule (20 mg total) by mouth daily.    Dispense:   30 capsule    Refill:  0  . amphetamine-dextroamphetamine (ADDERALL XR) 20 MG 24 hr capsule    Sig: Take 1 capsule (20 mg total) by mouth daily.    Dispense:  30 capsule    Refill:  0   Return precautions advised.  Garret Reddish, MD

## 2019-12-11 NOTE — Patient Instructions (Signed)
There are no preventive care reminders to display for this patient.  Depression screen [facility] 2/9 08/06/2019 10/17/2018 11/25/2017  Decreased Interest 0 0 0  Down, Depressed, Hopeless 0 0 0  PHQ - 2 Score 0 0 0    Recommended follow up: No follow-ups on file.

## 2020-02-04 ENCOUNTER — Encounter: Payer: BC Managed Care – PPO | Admitting: Family Medicine

## 2020-04-16 ENCOUNTER — Other Ambulatory Visit: Payer: Self-pay | Admitting: Family Medicine

## 2020-04-16 MED ORDER — AMPHETAMINE-DEXTROAMPHET ER 20 MG PO CP24: 20.0000 mg | ORAL_CAPSULE | Freq: Every day | ORAL | 0 refills | Status: DC

## 2020-04-16 NOTE — Telephone Encounter (Signed)
LAST APPOINTMENT DATE: 12/11/2019   NEXT APPOINTMENT DATE: Visit date not found    LAST REFILL: 12/03/2019 given scripts dated 2/18, 3/20, 4/19  QTY:

## 2020-04-16 NOTE — Telephone Encounter (Signed)
Patient calling in for refill on his Adderall. Please Advise.

## 2020-04-23 ENCOUNTER — Encounter: Payer: Self-pay | Admitting: Family Medicine

## 2020-04-23 ENCOUNTER — Ambulatory Visit (INDEPENDENT_AMBULATORY_CARE_PROVIDER_SITE_OTHER): Payer: BC Managed Care – PPO

## 2020-04-23 ENCOUNTER — Ambulatory Visit (INDEPENDENT_AMBULATORY_CARE_PROVIDER_SITE_OTHER): Payer: BC Managed Care – PPO | Admitting: Family Medicine

## 2020-04-23 ENCOUNTER — Other Ambulatory Visit: Payer: Self-pay

## 2020-04-23 VITALS — BP 124/70 | HR 93 | Temp 98.2°F | Ht 70.0 in | Wt 228.2 lb

## 2020-04-23 DIAGNOSIS — M79672 Pain in left foot: Secondary | ICD-10-CM | POA: Diagnosis not present

## 2020-04-23 DIAGNOSIS — F988 Other specified behavioral and emotional disorders with onset usually occurring in childhood and adolescence: Secondary | ICD-10-CM | POA: Diagnosis not present

## 2020-04-23 MED ORDER — DICLOFENAC SODIUM 75 MG PO TBEC
75.0000 mg | DELAYED_RELEASE_TABLET | Freq: Two times a day (BID) | ORAL | 0 refills | Status: DC
Start: 2020-04-23 — End: 2020-12-22

## 2020-04-23 MED ORDER — AMPHETAMINE-DEXTROAMPHET ER 20 MG PO CP24: 20.0000 mg | ORAL_CAPSULE | ORAL | 0 refills | Status: DC

## 2020-04-23 MED ORDER — AMPHETAMINE-DEXTROAMPHET ER 20 MG PO CP24: 20.0000 mg | ORAL_CAPSULE | Freq: Every day | ORAL | 0 refills | Status: DC

## 2020-04-23 NOTE — Progress Notes (Signed)
Phone [phone] In person visit   Subjective:   Eric Gould is a 24 y.o. year old very pleasant male patient who presents for/with See problem oriented charting Chief Complaint  Patient presents with  . Follow-up  . left foot pain  Consider This visit occurred during the SARS-CoV-2 public health emergency.  Safety protocols were in place, including screening questions prior to the visit, additional usage of staff PPE, and extensive cleaning of exam room while observing appropriate contact time as indicated for disinfecting solutions.   Past Medical History-  Patient Active Problem List   Diagnosis Date Noted  . Attention deficit disorder (ADD) in adult 11/06/2013    Priority: High  . Asthma, moderate persistent 11/06/2013    Priority: Medium  . Allergic rhinitis 11/06/2013    Priority: Low  . Leg length inequality 02/18/2018  . Chronic right-sided low back pain without sciatica 02/01/2018    Medications- reviewed and updated Current Outpatient Medications  Medication Sig Dispense Refill  . albuterol (VENTOLIN HFA) 108 (90 Base) MCG/ACT inhaler INHALE 2 PUFFS INTO THE LUNGS EVERY 6 HOURS AS NEEDED FOR SHORTNESS OF BREATH 18 Inhaler 4  . [START ON 06/09/2020] amphetamine-dextroamphetamine (ADDERALL XR) 20 MG 24 hr capsule Take 1 capsule (20 mg total) by mouth daily. Refill today 30 capsule 0  . [START ON 05/10/2020] amphetamine-dextroamphetamine (ADDERALL XR) 20 MG 24 hr capsule Take 1 capsule (20 mg total) by mouth daily. Refill in 1 month 30 capsule 0  . beclomethasone (QVAR) 40 MCG/ACT inhaler INHALE 2 PUFFS INTO THE LUNGS 2 (TWO) TIMES DAILY. 26.1 g 11  . fexofenadine (ALLEGRA) 180 MG tablet Take 180 mg by mouth daily.    Marland Kitchen amphetamine-dextroamphetamine (ADDERALL XR) 20 MG 24 hr capsule Take 1 capsule (20 mg total) by mouth every morning. Refill in 2 months 30 capsule 0  . diclofenac (VOLTAREN) 75 MG EC tablet Take 1 tablet (75 mg total) by mouth 2 (two) times daily. 14  tablet 0   No current facility-administered medications for this visit.     Objective:  BP 124/70   Pulse 93   Temp 98.2 F (36.8 C)   Ht 5\' 10"  (1.778 m)   Wt 228 lb 3.2 oz (103.5 kg)   SpO2 98%   BMI 32.74 kg/m  Gen: NAD, resting comfortably Left foot and ankle exam largely normal-patient does have pain towards the proximal portion of his left fifth metatarsal and possibly over the cuboid area.  No pain with palpation of plantar fascia.  Good strength in the foot.  Able to stand on toes without difficulty.  Gait largely normal but does report recently taking ibuprofen and pain being pretty well controlled at the time     Assessment and Plan   # Left Foot pain  S: pt c/o pain on the lateral left  foot radiating to below his  Ankle- he noticed it after lifting weights about a week ago. Does heavier weights like squats and deadlifts barefoot and last week he did some things wouldn't normally- heavy squat day, leg press- new and heavy, rack pull after deadlifts- all barefoot.  He has been taking ibuprofen 4 in AM and starts to really hurt by end of the day but if not working late does ok and ices when he gets home- icing helps some. He states in the morning it hard to walk on.no bruising or swelling.   wlking with some limping. Feels better to be more on toes  At its  worst pain is 7-8/10 in morning.   A/P:  Left lateral foot pain after bearing heavy load with squats/deadlifts/leg press/rack pulls barefoot.   -We will get x-ray due to the amount of weight he was under at the time of injury with heavy lifting -We will trial a course of Voltaren 75 mg twice a day.  Cautioned him this can cause reflux symptoms and if it does consider medication like Pepcid.  If any blood in stool or dark black stool should stop immediately and let us know -I want him to take it easy with heavy lifting for now-can still do upper body but avoid any weight on the right -If not improving significantly  within a week I want him to let me know and I will refer to sports medicine - I do wonder about possible lisfranc injury- but no significant bruising or swelling has been noted. As above low threshold to get into sports medicine if not improving.  - walks a lot at work- he wants to continue to work if possible- discussed depending on x-rays may have to pull back  # adult ADD S: Compliant with Adderall 20 mg extended release.  Well controlled  - Controlled substance contract signed March 09, 2016 - Regular urine drug screens with [place] [facility].  - Diagnosis by Dr. Rosaria Ferries pediatrics A/P: good control - continue current meds- 3 month refill and can request in 3 months then see each other in 52months  -NCCSRS reviewed low risk -only Rx through [place]  #Does report his shoulder is improving-has been able to get back in a new distress recently  Recommended follow up: 6 months physical  Lab/Order associations:   ICD-10-CM   1. Left foot pain  M79.672 DG Foot Complete Left    Meds ordered this encounter  Medications  . diclofenac (VOLTAREN) 75 MG EC tablet    Sig: Take 1 tablet (75 mg total) by mouth 2 (two) times daily.    Dispense:  14 tablet    Refill:  0  . amphetamine-dextroamphetamine (ADDERALL XR) 20 MG 24 hr capsule    Sig: Take 1 capsule (20 mg total) by mouth daily. Refill today    Dispense:  30 capsule    Refill:  0  . amphetamine-dextroamphetamine (ADDERALL XR) 20 MG 24 hr capsule    Sig: Take 1 capsule (20 mg total) by mouth daily. Refill in 1 month    Dispense:  30 capsule    Refill:  0  . amphetamine-dextroamphetamine (ADDERALL XR) 20 MG 24 hr capsule    Sig: Take 1 capsule (20 mg total) by mouth every morning. Refill in 2 months    Dispense:  30 capsule    Refill:  0   Return precautions advised.  Garret Reddish, MD

## 2020-04-23 NOTE — Patient Instructions (Addendum)
Left lateral foot pain -We will get x-ray due to the amount of weight he was under at the time of injury with heavy lifting -We will trial a course of Voltaren 75 mg twice a day.  Cautioned him this can cause reflux symptoms and if it does consider medication like Pepcid.  If any blood in stool or dark black stool should stop immediately and let us know -I want him to take it easy with heavy lifting for now-can still do upper body but avoid any weight on the right -If not improving significantly within a week I want him to let me know and I will refer to sports medicine -if symptoms worsen despite the above we should go ahead and get him into sports medicine  Please stop by x-ray before you go If you have mychart- we will send your results within 3 business days of [place] receiving them.  If you do not have mychart- we will call you about results within 5 business days of [place] receiving them.

## 2020-07-31 ENCOUNTER — Encounter: Payer: Self-pay | Admitting: Family Medicine

## 2020-07-31 ENCOUNTER — Other Ambulatory Visit: Payer: Self-pay

## 2020-07-31 MED ORDER — ALBUTEROL SULFATE HFA 108 (90 BASE) MCG/ACT IN AERS: INHALATION_SPRAY | RESPIRATORY_TRACT | 3 refills | Status: DC

## 2020-08-25 ENCOUNTER — Encounter: Payer: Self-pay | Admitting: Family Medicine

## 2020-08-25 MED ORDER — AMPHETAMINE-DEXTROAMPHET ER 20 MG PO CP24: 20.0000 mg | ORAL_CAPSULE | ORAL | 0 refills | Status: DC

## 2020-08-25 MED ORDER — AMPHETAMINE-DEXTROAMPHET ER 20 MG PO CP24: 20.0000 mg | ORAL_CAPSULE | Freq: Every day | ORAL | 0 refills | Status: DC

## 2020-09-30 ENCOUNTER — Encounter: Payer: Self-pay | Admitting: Family Medicine

## 2020-10-23 ENCOUNTER — Encounter: Payer: BC Managed Care – PPO | Admitting: Family Medicine

## 2020-11-27 ENCOUNTER — Encounter: Payer: Self-pay | Admitting: Family Medicine

## 2020-11-27 MED ORDER — AMPHETAMINE-DEXTROAMPHET ER 20 MG PO CP24
20.0000 mg | ORAL_CAPSULE | Freq: Every day | ORAL | 0 refills | Status: DC
Start: 2020-11-27 — End: 2020-12-22

## 2020-11-27 NOTE — Telephone Encounter (Signed)
Scheduled an appointment for medication refill for Adderall on 12/22/2020. Pt asked if we could send in enough medication to his appointment. Please advise.

## 2020-12-22 ENCOUNTER — Other Ambulatory Visit: Payer: Self-pay

## 2020-12-22 ENCOUNTER — Ambulatory Visit (INDEPENDENT_AMBULATORY_CARE_PROVIDER_SITE_OTHER): Payer: BC Managed Care – PPO | Admitting: Family Medicine

## 2020-12-22 ENCOUNTER — Encounter: Payer: Self-pay | Admitting: Family Medicine

## 2020-12-22 VITALS — BP 128/82 | HR 79 | Temp 99.4°F | Ht 70.0 in | Wt 219.2 lb

## 2020-12-22 DIAGNOSIS — F988 Other specified behavioral and emotional disorders with onset usually occurring in childhood and adolescence: Secondary | ICD-10-CM

## 2020-12-22 MED ORDER — AMPHETAMINE-DEXTROAMPHET ER 20 MG PO CP24: 20.0000 mg | ORAL_CAPSULE | ORAL | 0 refills | Status: DC

## 2020-12-22 MED ORDER — AMPHETAMINE-DEXTROAMPHET ER 20 MG PO CP24
20.0000 mg | ORAL_CAPSULE | Freq: Every day | ORAL | 0 refills | Status: DC
Start: 2020-12-22 — End: 2021-11-09

## 2020-12-22 NOTE — Patient Instructions (Addendum)
See you in April! Glad things are working well

## 2020-12-22 NOTE — Progress Notes (Signed)
Phone [phone] In person visit   Subjective:   Eric Gould is a 25 y.o. year old very pleasant male patient who presents for/with See problem oriented charting Chief Complaint  Patient presents with  . medicaiton Management   This visit occurred during the SARS-CoV-2 public health emergency.  Safety protocols were in place, including screening questions prior to the visit, additional usage of staff PPE, and extensive cleaning of exam room while observing appropriate contact time as indicated for disinfecting solutions.   Past Medical History-  Patient Active Problem List   Diagnosis Date Noted  . Attention deficit disorder (ADD) in adult 11/06/2013    Priority: High  . Asthma, moderate persistent 11/06/2013    Priority: Medium  . Allergic rhinitis 11/06/2013    Priority: Low  . Leg length inequality 02/18/2018  . Chronic right-sided low back pain without sciatica 02/01/2018    Medications- reviewed and updated Current Outpatient Medications  Medication Sig Dispense Refill  . albuterol (VENTOLIN HFA) 108 (90 Base) MCG/ACT inhaler INHALE 2 PUFFS INTO THE LUNGS EVERY 6 HOURS AS NEEDED FOR SHORTNESS OF BREATH 18 g 3  . fexofenadine (ALLEGRA) 180 MG tablet Take 180 mg by mouth daily.    Marland Kitchen amphetamine-dextroamphetamine (ADDERALL XR) 20 MG 24 hr capsule Take 1 capsule (20 mg total) by mouth daily. Refill today 30 capsule 0  . amphetamine-dextroamphetamine (ADDERALL XR) 20 MG 24 hr capsule Take 1 capsule (20 mg total) by mouth every morning. Refill in 2 months 30 capsule 0  . amphetamine-dextroamphetamine (ADDERALL XR) 20 MG 24 hr capsule Take 1 capsule (20 mg total) by mouth daily. Refill in 1 month 30 capsule 0   No current facility-administered medications for this visit.     Objective:  BP 128/82   Pulse 79   Temp 99.4 F (37.4 C) (Temporal)   Ht 5\' 10"  (1.778 m)   Wt 219 lb 3.2 oz (99.4 kg)   SpO2 97%   BMI 31.45 kg/m  Gen: NAD, resting comfortably CV: RRR no  murmurs rubs or gallops Lungs: CTAB no crackles, wheeze, rhonchi Ext: no edema Skin: warm, dry Neuro: grossly normal, moves all extremities      Assessment and Plan   # ADD S: Compliant with Adderall 20 mg extended release. Well controlled on this regimen.   - Controlled substance contract signed March 09, 2016 - Regular urine drug screens with March 11, 2016 [facility].  - Diagnosis by Dr. [place] pediatrics  Since the last visit has the patient had any:  Appetite changes? No Unintentional weight loss?No Is medication working well ? Yes Does patient take drug holidays? No Difficulties falling to sleep or maintaining sleep? No Any anxiety?  No Any cardiac issues (fainting or paliptations)?No Suicidal thoughts? No Changes in health since last visit? No New medications? No Any illicit substance abuse? No Has the patient taken his medication today? Yes A/P:  Stable. Continue current medications. Refills for 3 months-and has physical at that time -[place]CCS[place] reviewed today -only Rx through [facility]  Recommended follow up: Return in about 6 months (around 06/21/2021) for physical or sooner if needed. please schedule this before you leave Future Appointments  Date Time Provider Department Center  02/23/2021  9:40 AM 04/25/2021, Durene Cal, MD [facility]   Lab/Order associations:   ICD-10-CM   1. Attention deficit disorder (ADD) in adult  F98.8     Meds ordered this encounter  Medications  . amphetamine-dextroamphetamine (ADDERALL XR) 20 MG 24 hr capsule    Sig:  Take 1 capsule (20 mg total) by mouth daily. Refill today    Dispense:  30 capsule    Refill:  0  . amphetamine-dextroamphetamine (ADDERALL XR) 20 MG 24 hr capsule    Sig: Take 1 capsule (20 mg total) by mouth every morning. Refill in 2 months    Dispense:  30 capsule    Refill:  0  . amphetamine-dextroamphetamine (ADDERALL XR) 20 MG 24 hr capsule    Sig: Take 1 capsule (20 mg total) by mouth daily. Refill in 1 month    Dispense:  30  capsule    Refill:  0    Return precautions advised.  Tana Conch, MD

## 2021-02-19 NOTE — Progress Notes (Deleted)
Phone: [phone]    Subjective:  Patient presents today for their annual physical. Chief complaint-noted.   See problem oriented charting- ROS- full  review of systems was completed and negative  except for: ***  The following were reviewed and entered/updated in epic: Past Medical History:  Diagnosis Date  . Asthma    Patient Active Problem List   Diagnosis Date Noted  . Leg length inequality 02/18/2018  . Chronic right-sided low back pain without sciatica 02/01/2018  . Asthma, moderate persistent 11/06/2013  . Attention deficit disorder (ADD) in adult 11/06/2013  . Allergic rhinitis 11/06/2013   Past Surgical History:  Procedure Laterality Date  . CLEFT PALATE REPAIR    . CYST EXCISION     left face- benign    Family History  Problem Relation Age of Onset  . Asthma Mother   . Other Father        passed from hunting accident    Medications- reviewed and updated Current Outpatient Medications  Medication Sig Dispense Refill  . albuterol (VENTOLIN HFA) 108 (90 Base) MCG/ACT inhaler INHALE 2 PUFFS INTO THE LUNGS EVERY 6 HOURS AS NEEDED FOR SHORTNESS OF BREATH 18 g 3  . amphetamine-dextroamphetamine (ADDERALL XR) 20 MG 24 hr capsule Take 1 capsule (20 mg total) by mouth daily. Refill today 30 capsule 0  . amphetamine-dextroamphetamine (ADDERALL XR) 20 MG 24 hr capsule Take 1 capsule (20 mg total) by mouth every morning. Refill in 2 months 30 capsule 0  . amphetamine-dextroamphetamine (ADDERALL XR) 20 MG 24 hr capsule Take 1 capsule (20 mg total) by mouth daily. Refill in 1 month 30 capsule 0  . fexofenadine (ALLEGRA) 180 MG tablet Take 180 mg by mouth daily.     No current facility-administered medications for this visit.    Allergies-reviewed and updated No Known Allergies  Social History   Social History Narrative   Married in August 30 2020. Started dating now wife in 2017.       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 Majored in criminal justice   Formerly [employer]      Hobbies: hunting, fishing, shooting      Objective:  There were no vitals taken for this visit. Gen: NAD, resting comfortably HEENT: Mucous membranes are moist. Oropharynx normal Neck: no thyromegaly CV: RRR no murmurs rubs or gallops Lungs: CTAB no crackles, wheeze, rhonchi Abdomen: soft/nontender/nondistended/normal bowel sounds. No rebound or guarding.  Ext: no edema Skin: warm, dry Neuro: grossly normal, moves all extremities, PERRLA ***    Assessment and Plan:  25 y.o. male presenting for annual physical.  Health Maintenance counseling: 1. Anticipatory guidance: Patient counseled regarding regular dental exams ***q6 months, eye exams ***,  avoiding smoking and second hand smoke*** , limiting alcohol to 2 beverages per day***.   2. Risk factor reduction:  Advised patient of need for regular exercise and diet rich and fruits and vegetables to reduce risk of heart attack and stroke. Exercise- ***. Diet-***.  Wt Readings from Last 3 Encounters:  12/22/20 219 lb 3.2 oz (99.4 kg)  04/23/20 228 lb 3.2 oz (103.5 kg)  12/11/19 214 lb (97.1 kg)   3. Immunizations/screenings/ancillary studies Immunization History  Administered Date(s) Administered  . Hpv-Unspecified 05/06/2011, 07/08/2011, 11/18/2011  . Influenza Inj Mdck Quad Pf 09/02/2017  . Influenza,inj,Quad PF,6+ Mos 10/17/2018, 08/06/2019  . Influenza-Unspecified 09/24/2015, 09/21/2016, 07/23/2017, 07/22/2020  .  Janssen (J&J) SARS-COV-2 Vaccination 12/12/2020  . MMR 04/06/1995, 08/05/1998  . Tdap 05/29/2015   Health Maintenance Due  Topic Date Due  . Hepatitis C Screening  Never done  . COVID-19 Vaccine (2 - Booster for Janssen series) 02/06/2021    Family History  Problem Relation Age of Onset  . Asthma Mother   . Other Father        passed from hunting accident   4. Prostate  cancer screening- *** No results found for: PSA 5. Colon cancer screening - *** 6. Skin cancer screening/prevention- ***advised regular sunscreen use. Denies worrisome, changing, or new skin lesions.  7. Testicular cancer screening- advised monthly self exams *** 8. STD screening- patient opts *** 9. *** smoker-   Status of chronic or acute concerns   # Asthma # Allergic Rhinitis S:Using Albuterol HFA prn.   Allergies at times can trigger asthma-takes Allegra regularly A/P: ***   # ADD S: Compliant with Adderall 20 mg extended release.  Well-controlled***  - Controlled substance contract signed March 09, 2016 - Regular urine drug screens with [place] [facility].***  - Diagnosis by Dr. Rosaria [facility] pediatrics A/P: ***  -[place] reviewed*** -only Rx through [place] *** Preventative health care  Recommended follow up: ***No follow-ups on file. Future Appointments  Date Time Provider [place]  02/23/2021  9:40 AM Yong Channel, Brayton Mars, MD [facility]    No chief complaint on file.  Lab/Order associations:*** fasting   ICD-10-CM   1. Preventative health care  Z00.00     No orders of the defined types were placed in this encounter.   Return precautions advised.   Clyde Lundborg, CMA

## 2021-02-19 NOTE — Patient Instructions (Incomplete)
Please stop by lab before you go If you have mychart- we will send your results within 3 business days of [place] receiving them.  If you do not have mychart- we will call you about results within 5 business days of [place] receiving them.  *please also note that you will see labs on mychart as soon as they post. I will later go in and write notes on them- will say "notes from Dr. Durene Cal"  Health Maintenance Due  Topic Date Due  . COVID-19 Vaccine (2 - Booster for Janssen series) 02/06/2021   Depression screen [facility] 2/9 12/22/2020 04/23/2020 08/06/2019  Decreased Interest 0 0 0  Down, Depressed, Hopeless 0 0 0  PHQ - 2 Score 0 0 0

## 2021-02-23 ENCOUNTER — Encounter: Payer: BC Managed Care – PPO | Admitting: Family Medicine

## 2021-02-23 DIAGNOSIS — Z0000 Encounter for general adult medical examination without abnormal findings: Secondary | ICD-10-CM

## 2021-02-23 DIAGNOSIS — F988 Other specified behavioral and emotional disorders with onset usually occurring in childhood and adolescence: Secondary | ICD-10-CM

## 2021-02-23 DIAGNOSIS — Z1159 Encounter for screening for other viral diseases: Secondary | ICD-10-CM

## 2021-07-17 NOTE — Progress Notes (Incomplete)
Phone: [phone]    Subjective:  Patient presents today for their annual physical. Chief complaint-noted.   See problem oriented charting- ROS- full  review of systems was completed and negative  except for: ***  The following were reviewed and entered/updated in epic: Past Medical History:  Diagnosis Date   Asthma    Patient Active Problem List   Diagnosis Date Noted   Leg length inequality 02/18/2018   Chronic right-sided low back pain without sciatica 02/01/2018   Asthma, moderate persistent 11/06/2013   Attention deficit disorder (ADD) in adult 11/06/2013   Allergic rhinitis 11/06/2013   Past Surgical History:  Procedure Laterality Date   CLEFT PALATE REPAIR     CYST EXCISION     left face- benign    Family History  Problem Relation Age of Onset   Asthma Mother    Other Father        passed from hunting accident    Medications- reviewed and updated Current Outpatient Medications  Medication Sig Dispense Refill   albuterol (VENTOLIN HFA) 108 (90 Base) MCG/ACT inhaler INHALE 2 PUFFS INTO THE LUNGS EVERY 6 HOURS AS NEEDED FOR SHORTNESS OF BREATH 18 g 3   amphetamine-dextroamphetamine (ADDERALL XR) 20 MG 24 hr capsule Take 1 capsule (20 mg total) by mouth daily. Refill today 30 capsule 0   amphetamine-dextroamphetamine (ADDERALL XR) 20 MG 24 hr capsule Take 1 capsule (20 mg total) by mouth every morning. Refill in 2 months 30 capsule 0   amphetamine-dextroamphetamine (ADDERALL XR) 20 MG 24 hr capsule Take 1 capsule (20 mg total) by mouth daily. Refill in 1 month 30 capsule 0   fexofenadine (ALLEGRA) 180 MG tablet Take 180 mg by mouth daily.     No current facility-administered medications for this visit.    Allergies-reviewed and updated No Known Allergies  Social History   Social History Narrative   Married in August 30 2020. Started dating now wife in 2017.       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 Majored in criminal justice   Formerly [employer]      Hobbies: hunting, fishing, shooting      Objective:  There were no vitals taken for this visit. Gen: NAD, resting comfortably HEENT: Mucous membranes are moist. Oropharynx normal Neck: no thyromegaly CV: RRR no murmurs rubs or gallops Lungs: CTAB no crackles, wheeze, rhonchi Abdomen: soft/nontender/nondistended/normal bowel sounds. No rebound or guarding.  Ext: no edema Skin: warm, dry Neuro: grossly normal, moves all extremities, PERRLA ***    Assessment and Plan:  25 y.o. male presenting for annual physical.  Health Maintenance counseling: 1. Anticipatory guidance: Patient counseled regarding regular dental exams ***q6 months, eye exams- no issues ***,  avoiding smoking and second hand smoke*** , limiting alcohol to 2 beverages per day-about once a week***.   2. Risk factor reduction:  Advised patient of need for regular exercise and diet rich and fruits and vegetables to reduce risk of heart attack and stroke. Exercise- had maintained regular exercise***. Diet- down 9lbs (01/2019) -tried to eat cleaner after moving in with GF***.  Wt Readings from Last 3 Encounters:  12/22/20 219 lb 3.2 oz (99.4 kg)  04/23/20 228 lb 3.2 oz (103.5 kg)  12/11/19 214 lb (97.1 kg)   3. Immunizations/screenings/ancillary studies DISCUSSED:  - Prevnar 20 vaccination #1 -Hepatitis C (overdue; never done) -COVID-19 vaccination #  2 repeat planned -Flu vaccination ( last one 07/22/2020) Immunization History  Administered Date(s) Administered   Hpv-Unspecified 05/06/2011, 07/08/2011, 11/18/2011   Influenza Inj Mdck Quad Pf 09/02/2017   Influenza,inj,Quad PF,6+ Mos 10/17/2018, 08/06/2019   Influenza-Unspecified 09/24/2015, 09/21/2016, 07/23/2017, 07/22/2020   Janssen (J&J) SARS-COV-2 Vaccination 12/12/2020   MMR 04/06/1995, 08/05/1998   Tdap 05/29/2015   Health Maintenance Due   Topic Date Due   Pneumococcal Vaccine 0-64 Years old (1 - PCV) Never done   Hepatitis C Screening  Never done   COVID-19 Vaccine (2 - Janssen risk series) 01/09/2021   INFLUENZA VACCINE  06/22/2021    Family History  Problem Relation Age of Onset   Asthma Mother    Other Father        passed from hunting accident   4. Prostate cancer screening- no family history, start at age 55*** No results found for: PSA  5. Colon cancer screening - no family history, start at age 55*** 6. Skin cancer screening/prevention- had seen dermatologist in the past***advised regular sunscreen use. Denies worrisome, changing, or new skin lesions. Wart on right wrist-declined cryotherapy at that time.*** 7. Testicular cancer screening- advised monthly self exams *** 8. STD screening- patient opted out- monogamous with GF *** 9. Never *** smoker-   Status of chronic or acute concerns   # Asthma S: Maintenance Medication: Complaint with albuterol hfa-perhaps once a month- no longer having to use Qvar A/P: ***   #ADD S:Compliant with Adderall 20 mg extended release. Well controlled on this regimen.   - Controlled substance contract signed March 09, 2016 - Regular urine drug screens with [facility].  - Diagnosis by Dr. [facility] pediatrics  Since the last visit did the patient had any:   Appetite changes? No *** Unintentional weight loss?No *** Is medication working well ? Yes *** Does patient take drug holidays? No *** Difficulties falling to sleep or maintaining sleep? No *** Any anxiety?  No *** Any cardiac issues (fainting or paliptations)?No *** Suicidal thoughts? No *** Changes in health since last visit? No *** New medications? No *** Any illicit substance abuse? No *** Has the patient taken his medication today? Yes ***  -NCCSRS reviewed at this time 11/2020 -only Rx through us A/P: ***  #Wrist pain-started with pain 1.5 years ago worsened with turning motion- went to [facility] and  they did MRI and everything was ok- was told ganglion cyst - he didn't want to pursue surgery at that time - will monitor.     Back overall was much better after working with PT and Dr. Rigby. Did have some neck tension and pain under right shoulder blade- he would like to get back in with PT as found helpful- wanted to talk about possible dry needling with Dr. Carroll. Referred.  Recommended follow up: No follow-ups on file. Future Appointments  Date Time Provider Department Center  07/20/2021  1:20 PM [name], Stephen O, MD [facility]    No chief complaint on file.  Lab/Order associations:*** fasting No diagnosis found.  No orders of the defined types were placed in this encounter.   I,Jada Bradford,acting as a scribe for [name], MD.,have documented all relevant documentation on the behalf of [name], MD,as directed by  [name], MD while in the presence of [name], MD.  *** Return precautions advised.   Jada Bradford    

## 2021-07-20 ENCOUNTER — Encounter: Payer: BC Managed Care – PPO | Admitting: Family Medicine

## 2021-08-27 ENCOUNTER — Encounter: Payer: Self-pay | Admitting: Family Medicine

## 2021-08-31 ENCOUNTER — Telehealth (INDEPENDENT_AMBULATORY_CARE_PROVIDER_SITE_OTHER): Payer: BC Managed Care – PPO | Admitting: Family Medicine

## 2021-08-31 ENCOUNTER — Telehealth: Payer: Self-pay

## 2021-08-31 ENCOUNTER — Encounter: Payer: Self-pay | Admitting: Family Medicine

## 2021-08-31 DIAGNOSIS — F988 Other specified behavioral and emotional disorders with onset usually occurring in childhood and adolescence: Secondary | ICD-10-CM

## 2021-08-31 DIAGNOSIS — J4521 Mild intermittent asthma with (acute) exacerbation: Secondary | ICD-10-CM | POA: Diagnosis not present

## 2021-08-31 MED ORDER — ALBUTEROL SULFATE HFA 108 (90 BASE) MCG/ACT IN AERS
INHALATION_SPRAY | RESPIRATORY_TRACT | 3 refills | Status: DC
Start: 2021-08-31 — End: 2022-09-01

## 2021-08-31 MED ORDER — PREDNISONE 20 MG PO TABS: ORAL_TABLET | ORAL | 0 refills | Status: DC
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
## 2021-08-31 NOTE — Telephone Encounter (Signed)
Yes needs visit- id be willing to work him in virtually to discuss at last slot of day

## 2021-08-31 NOTE — Telephone Encounter (Signed)
  Encourage patient to contact the pharmacy for refills or they can request refills through [facility]  LAST APPOINTMENT DATE: 12/22/20  NEXT APPOINTMENT DATE: 11/09/2021  MEDICATION: Prednisone and albuterol (VENTOLIN HFA) 108 (90 Base) MCG/ACT inhaler  Is the patient out of medication? Yes   COMMENTS: Patient had two asthma attacks this weekend and is wondering if he can get prednisone sent in and he is out of his emergency inhaler.   PHARMACY: [facility] Address: [address], [place], [place] [address]

## 2021-08-31 NOTE — Telephone Encounter (Signed)
Patient is scheduled today.

## 2021-08-31 NOTE — Telephone Encounter (Signed)
Also sending to admin team in case can get him in quicker

## 2021-08-31 NOTE — Telephone Encounter (Signed)
See below

## 2021-08-31 NOTE — Telephone Encounter (Signed)
Do you want to see pt before sending in the inhalers and prednisone due to 2 asthma attacks this weekend?

## 2021-09-16 DIAGNOSIS — Z131 Encounter for screening for diabetes mellitus: Secondary | ICD-10-CM | POA: Diagnosis not present

## 2021-09-16 DIAGNOSIS — Z13220 Encounter for screening for lipoid disorders: Secondary | ICD-10-CM | POA: Diagnosis not present

## 2021-09-16 DIAGNOSIS — Z136 Encounter for screening for cardiovascular disorders: Secondary | ICD-10-CM | POA: Diagnosis not present

## 2021-11-02 NOTE — Progress Notes (Signed)
Phone: [phone]    Subjective:  Patient presents today for their annual physical. Chief complaint-noted.   See problem oriented charting- ROS- full  review of systems was completed and negative  per full ROS sheet  The following were reviewed and entered/updated in epic: Past Medical History:  Diagnosis Date   Asthma    Patient Active Problem List   Diagnosis Date Noted   Attention deficit disorder (ADD) in adult 11/06/2013    Priority: High   Asthma, mild intermittent 11/06/2013    Priority: Medium    Allergic rhinitis 11/06/2013    Priority: Low   Leg length inequality 02/18/2018   Chronic right-sided low back pain without sciatica 02/01/2018   Past Surgical History:  Procedure Laterality Date   CLEFT PALATE REPAIR     CYST EXCISION     left face- benign    Family History  Problem Relation Age of Onset   Asthma Mother    Other Father        passed from hunting accident    Medications- reviewed and updated Current Outpatient Medications  Medication Sig Dispense Refill   albuterol (VENTOLIN HFA) 108 (90 Base) MCG/ACT inhaler INHALE 2 PUFFS INTO THE LUNGS EVERY 6 HOURS AS NEEDED FOR SHORTNESS OF BREATH 18 g 3   fexofenadine (ALLEGRA) 180 MG tablet Take 180 mg by mouth daily.     No current facility-administered medications for this visit.    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 Started dating now wife in 2017.    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 Majored in criminal justice   Formerly [employer]      Hobbies: hunting, fishing, shooting      Objective:  BP 138/78 (BP Location: Left Arm, Patient Position: Sitting)   Pulse 88   Ht 5' 10.5" (1.791 m)   Wt 232 lb  (105.2 kg)   SpO2 98%   BMI 32.82 kg/m  Gen: NAD, resting comfortably HEENT: Mucous membranes are moist. Oropharynx normal Neck: no thyromegaly CV: RRR no murmurs rubs or gallops Lungs: CTAB no crackles, wheeze, rhonchi Abdomen: soft/nontender/nondistended/normal bowel sounds. No rebound or guarding.  Ext: no edema Skin: warm, dry Neuro: grossly normal, moves all extremities, PERRLA    Assessment and Plan:  25 y.o. male presenting for annual physical.  Health Maintenance counseling: 1. Anticipatory guidance: Patient counseled regarding regular dental exams -q6 months, eye exams -no issues,  avoiding smoking and second hand smoke , limiting alcohol to 2 beverages per day-about 1 a week, no illicit drugs.   2. Risk factor reduction:  Advised patient of need for regular exercise and diet rich and fruits and vegetables to reduce risk of heart attack and stroke.  Exercise- has maintained his regular exercise.  Diet/weight management-weight up 13 pounds from earlier this year- some muscle and some fat- around 225 on home scales- weight higher in winter- when hunts doesn't work out and loves to Advertising copywriter.   Wt Readings from Last 3 Encounters:  11/09/21 232 lb (105.2 kg)  12/22/20 219 lb 3.2 oz (99.4 kg)  04/23/20 228 lb 3.2 oz (103.5 kg)  3. Immunizations/screenings/ancillary studies DISCUSSED:  -Prevnar-20 vaccination #1- recommended due to  asthma-patient declines -COVID booster vaccination #2- declines -Flu vaccination (last one 3/21) - already had this -Hepatitis C Screening- update with blood work next time we do this Immunization History  Administered Date(s) Administered   Hpv-Unspecified 05/06/2011, 07/08/2011, 11/18/2011   Influenza Inj Mdck Quad Pf 09/02/2017   Influenza,inj,Quad PF,6+ Mos 10/17/2018, 08/06/2019   Influenza-Unspecified 09/24/2015, 09/21/2016, 07/23/2017, 07/22/2020, 09/22/2021   Janssen (J&J) SARS-COV-2 Vaccination 12/12/2020   MMR 04/06/1995, 08/05/1998   Tdap  05/29/2015  4. Prostate cancer screening- no family history, start at age 46     5. Colon cancer screening -no family history, start at age   49. Skin cancer screening/prevention- has seen derm in the past- no recent visits. advised regular sunscreen use. Denies worrisome, changing, or new skin lesions. 7. Testicular cancer screening- advised monthly self exams  8. STD screening- patient opts out- monogomous with wife 9. Smoking associated screening- Never smoker  Status of chronic or acute concerns   # Asthma # Allergic Rhinitis S:Using Albuterol HFA prn- maybe once every few months Allergies at times can trigger asthma-takes Allegra aas needed- dust big trigger A/P: much improved overall- continue current prn albuterol    # ADD S: Compliant with Adderall 20 mg extended release.  Reports well-controlled - Controlled substance contract signed March 09, 2016 - Regular urine drug screens with [facility].  - Diagnosis by Dr. Rosaria Ferries pediatrics  He trialed off several months ago and did not notice a huge drop off- no significant issues.  A/P: ADD is reasonably well controlled and stable life situation-he will reach out if he has any transitions such as when he goes into the [place] and we can certainly restart the Adderall 20 mg extended release -[facility] reviewed today-only Rx through us-last refill much earlier in 2022 -slight weight gain off med but later improved  # we will plan on bloodwork last time- 2020 labs largely reassuring- slightly high lipids and slightly high bili per baseline- faint with needles so we opted to hold off one more cycle   Recommended follow up: Return in about 1 year (around 11/09/2022) for physical or sooner if needed. Can stretch out to 2 years if he wants as long as everything stable and improving weight and asthma not acting up  Lab/Order associations: Not fasting   ICD-10-CM   1. Preventative health care  Z00.00     2. Attention deficit disorder (ADD)  in adult  F98.8     3. Mild intermittent asthma with (acute) exacerbation  J45.21      I,Jada Bradford,acting as a scribe for Garret Reddish, MD.,have documented all relevant documentation on the behalf of Garret Reddish, MD,as directed by  Garret Reddish, MD while in the presence of Garret Reddish, MD.   I, Garret Reddish, MD, have reviewed all documentation for this visit. The documentation on 11/09/21 for the exam, diagnosis, procedures, and orders are all accurate and complete.   Return precautions advised.   Garret Reddish, MD

## 2021-11-09 ENCOUNTER — Ambulatory Visit (INDEPENDENT_AMBULATORY_CARE_PROVIDER_SITE_OTHER): Payer: BC Managed Care – PPO | Admitting: Family Medicine

## 2021-11-09 ENCOUNTER — Other Ambulatory Visit: Payer: Self-pay

## 2021-11-09 ENCOUNTER — Encounter: Payer: Self-pay | Admitting: Family Medicine

## 2021-11-09 VITALS — BP 138/78 | HR 88 | Ht 70.5 in | Wt 232.0 lb

## 2021-11-09 DIAGNOSIS — F988 Other specified behavioral and emotional disorders with onset usually occurring in childhood and adolescence: Secondary | ICD-10-CM

## 2021-11-09 DIAGNOSIS — J4521 Mild intermittent asthma with (acute) exacerbation: Secondary | ICD-10-CM

## 2021-11-09 DIAGNOSIS — Z0000 Encounter for general adult medical examination without abnormal findings: Secondary | ICD-10-CM | POA: Diagnosis not present

## 2021-11-09 NOTE — Patient Instructions (Addendum)
Great job on exercising! Please keep up the great work! Just continue to maintain or slightly decrease weight.  Recommended follow up: Return in about 1 year (around 11/09/2022) for physical or sooner if needed.  Happy Holidays!

## 2022-01-20 IMAGING — DX DG FOOT COMPLETE 3+V*L*
3 series · 3 of 3 positions shown · non-contrast
Comparison: None.

CLINICAL DATA: pain lateral foot towards proximal portion of
metatarsal 5th

EXAM:
LEFT FOOT - COMPLETE 3+ VIEW

[foot dp]
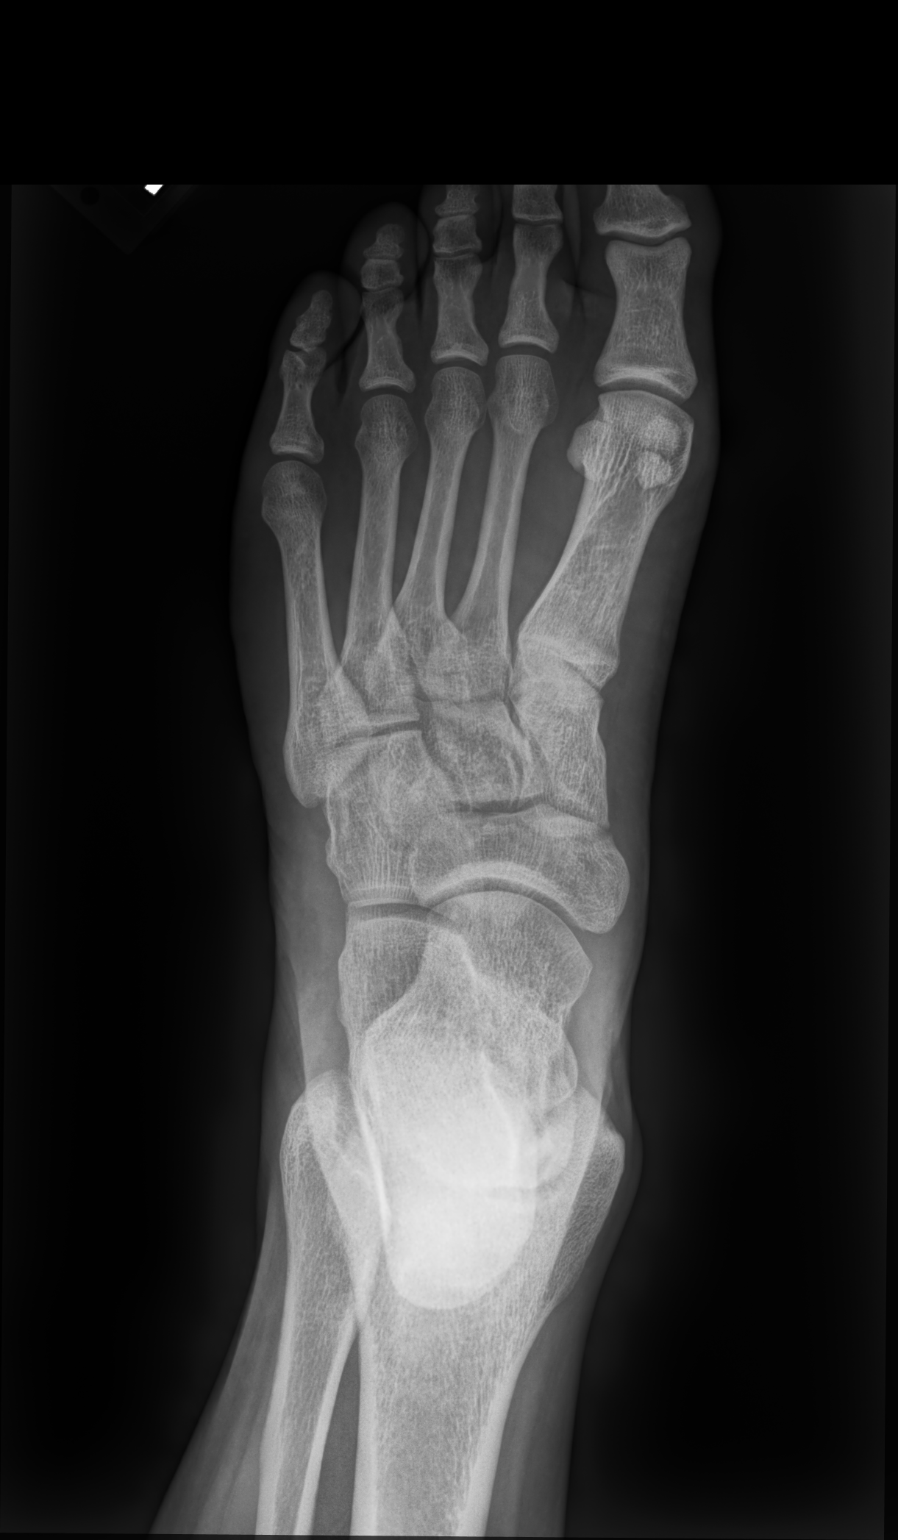

[foot oblique]
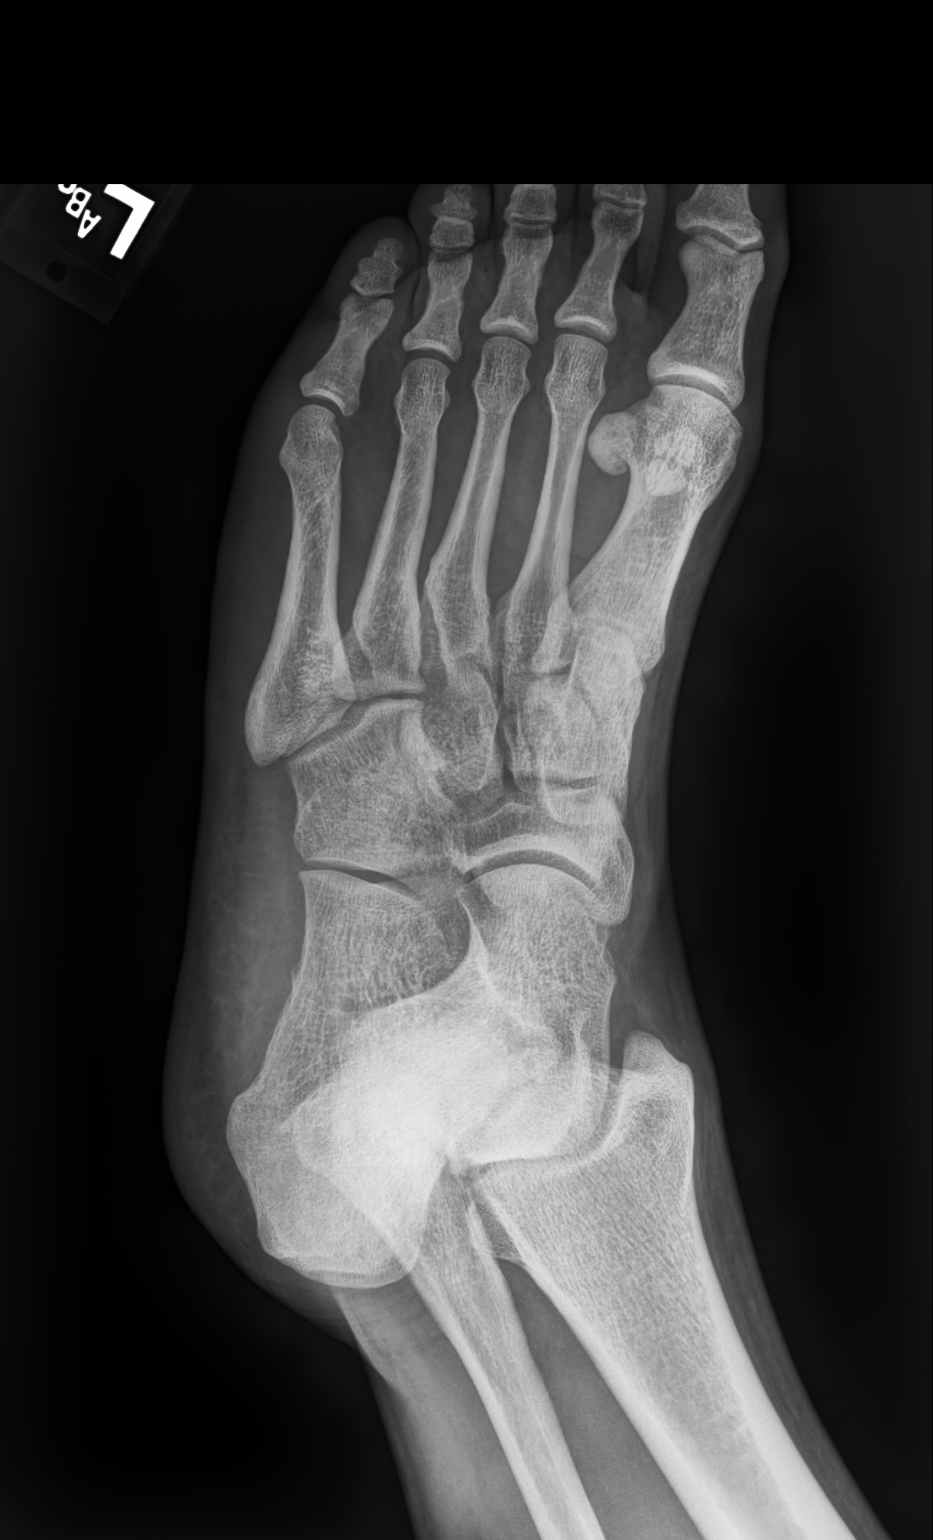

[foot lat]
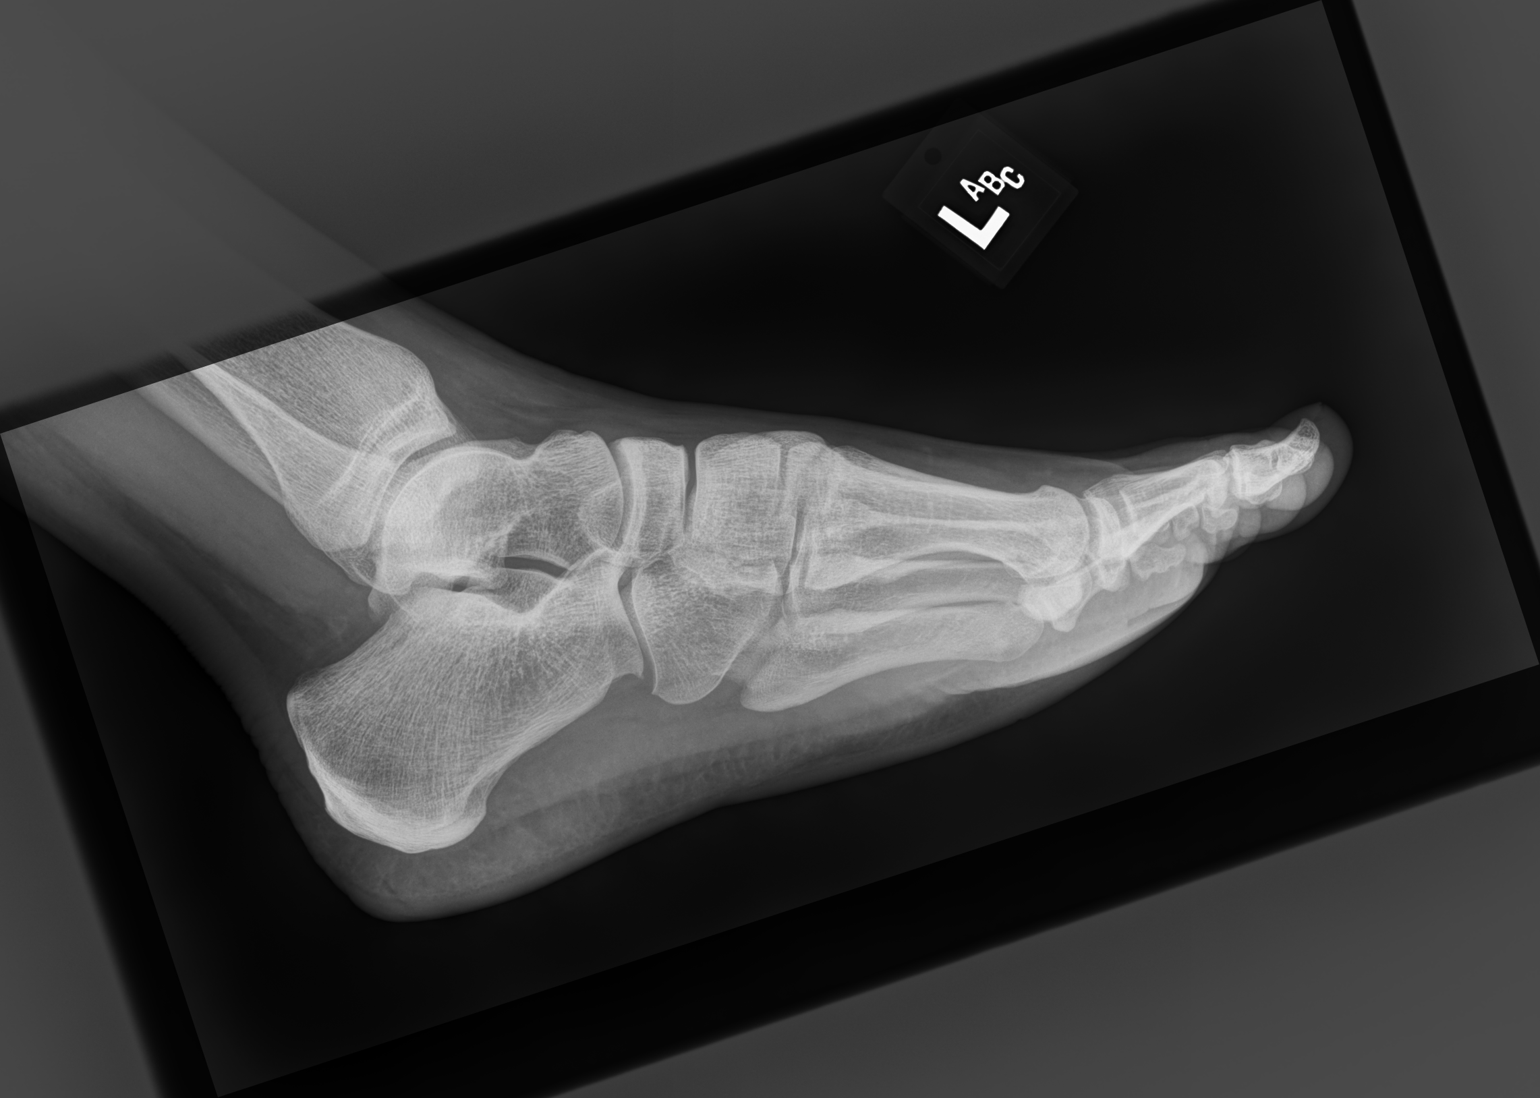

[3 of 3 positions shown; findings below may reference images not displayed]

FINDINGS: There is no evidence of fracture or dislocation. There is no
evidence of arthropathy or other focal bone abnormality. Soft
tissues are unremarkable.
IMPRESSION: Negative radiographs of the left foot.

## 2022-08-16 ENCOUNTER — Encounter: Payer: Self-pay | Admitting: *Deleted

## 2022-09-01 ENCOUNTER — Other Ambulatory Visit: Payer: Self-pay | Admitting: Family Medicine

## 2022-11-04 ENCOUNTER — Encounter: Payer: Self-pay | Admitting: *Deleted

## 2022-11-29 ENCOUNTER — Ambulatory Visit (INDEPENDENT_AMBULATORY_CARE_PROVIDER_SITE_OTHER): Payer: PRIVATE HEALTH INSURANCE | Admitting: Family Medicine

## 2022-11-29 ENCOUNTER — Encounter: Payer: Self-pay | Admitting: Family Medicine

## 2022-11-29 VITALS — BP 120/72 | HR 67 | Temp 98.5°F | Ht 70.5 in | Wt 233.0 lb

## 2022-11-29 DIAGNOSIS — Z13220 Encounter for screening for lipoid disorders: Secondary | ICD-10-CM | POA: Diagnosis not present

## 2022-11-29 DIAGNOSIS — Z1159 Encounter for screening for other viral diseases: Secondary | ICD-10-CM

## 2022-11-29 DIAGNOSIS — Z130 Encounter for screening for diseases of the blood and blood-forming organs and certain disorders involving the immune mechanism: Secondary | ICD-10-CM | POA: Diagnosis not present

## 2022-11-29 DIAGNOSIS — Z0000 Encounter for general adult medical examination without abnormal findings: Secondary | ICD-10-CM | POA: Diagnosis not present

## 2022-11-29 DIAGNOSIS — Z23 Encounter for immunization: Secondary | ICD-10-CM

## 2022-11-29 LAB — LIPID PANEL
Cholesterol: 191 mg/dL (ref 0–200)
HDL: 43.5 mg/dL (ref >39.00)
LDL Cholesterol: 131 mg/dL — ABNORMAL HIGH (ref 0–99)
NonHDL: 147.1
Total CHOL/HDL Ratio: 4
Triglycerides: 83 mg/dL (ref 0.0–149.0)
VLDL: 16.6 mg/dL (ref 0.0–40.0)

## 2022-11-29 LAB — CBC WITH DIFFERENTIAL/PLATELET
Basophils Absolute: 0 10*3/uL (ref 0.0–0.1)
Basophils Relative: 0.6 % (ref 0.0–3.0)
Eosinophils Absolute: 0.2 10*3/uL (ref 0.0–0.7)
Eosinophils Relative: 2.9 % (ref 0.0–5.0)
HCT: 49.8 % (ref 39.0–52.0)
Hemoglobin: 16.9 g/dL (ref 13.0–17.0)
Lymphocytes Relative: 28.6 % (ref 12.0–46.0)
Lymphs Abs: 1.5 10*3/uL (ref 0.7–4.0)
MCHC: 33.9 g/dL (ref 30.0–36.0)
MCV: 89.2 fl (ref 78.0–100.0)
Monocytes Absolute: 0.5 10*3/uL (ref 0.1–1.0)
Monocytes Relative: 8.5 % (ref 3.0–12.0)
Neutro Abs: 3.2 10*3/uL (ref 1.4–7.7)
Neutrophils Relative %: 59.4 % (ref 43.0–77.0)
Platelets: 243 10*3/uL (ref 150.0–400.0)
RBC: 5.58 Mil/uL (ref 4.22–5.81)
RDW: 12.6 % (ref 11.5–15.5)
WBC: 5.4 10*3/uL (ref 4.0–10.5)

## 2022-11-29 LAB — COMPREHENSIVE METABOLIC PANEL
ALT: 43 U/L (ref 0–53)
AST: 30 U/L (ref 0–37)
Albumin: 5 g/dL (ref 3.5–5.2)
Alkaline Phosphatase: 41 U/L (ref 39–117)
BUN: 15 mg/dL (ref 6–23)
CO2: 27 mEq/L (ref 19–32)
Calcium: 9.8 mg/dL (ref 8.4–10.5)
Chloride: 103 mEq/L (ref 96–112)
Creatinine, Ser: 1.04 mg/dL (ref 0.40–1.50)
GFR: 97.6 mL/min (ref >60.00)
Glucose, Bld: 82 mg/dL (ref 70–99)
Potassium: 4.4 mEq/L (ref 3.5–5.1)
Sodium: 139 mEq/L (ref 135–145)
Total Bilirubin: 1.4 mg/dL — ABNORMAL HIGH (ref 0.2–1.2)
Total Protein: 7.6 g/dL (ref 6.0–8.3)

## 2022-11-29 NOTE — Addendum Note (Signed)
Addended by: Marin Olp on: 11/29/2022 10:11 AM   Modules accepted: Level of Service

## 2022-11-29 NOTE — Progress Notes (Addendum)
Phone: [phone]    Subjective:  Patient presents today for their annual physical. Chief complaint-noted.   See problem oriented charting- ROS- full  review of systems was completed and negative  except for: some weight gain  The following were reviewed and entered/updated in epic: Past Medical History:  Diagnosis Date   Asthma    Patient Active Problem List   Diagnosis Date Noted   Attention deficit disorder (ADD) in adult 11/06/2013    Priority: High   Asthma, mild intermittent 11/06/2013    Priority: Medium    Allergic rhinitis 11/06/2013    Priority: Low   Leg length inequality 02/18/2018   Chronic right-sided low back pain without sciatica 02/01/2018   Past Surgical History:  Procedure Laterality Date   CLEFT PALATE REPAIR     CYST EXCISION     left face- benign    Family History  Problem Relation Age of Onset   Asthma Mother    Other Father        passed from hunting accident    Medications- reviewed and updated Current Outpatient Medications  Medication Sig Dispense Refill   albuterol (VENTOLIN HFA) 108 (90 Base) MCG/ACT inhaler INHALE TWO PUFFS BY MOUTH EVERY 6 HOURS AS NEEDED  FOR SHORTNESS OF BREATH 18 g 3   fexofenadine (ALLEGRA) 180 MG tablet Take 180 mg by mouth daily.     No current facility-administered medications for this visit.    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 Started dating wife in 2017.    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 Majored in criminal justice   Formerly [employer]      Hobbies: hunting, fishing, shooting      Objective:  BP 120/72   Pulse 67   Temp 98.5 F (36.9 C)   Ht 5'  10.5" (1.791 m)   Wt 233 lb (105.7 kg)   SpO2 98%   BMI 32.96 kg/m  Gen: NAD, resting comfortably HEENT: Mucous membranes are moist. Oropharynx normal. TM normal Neck: no thyromegaly CV: RRR no murmurs rubs or gallops Lungs: CTAB no crackles, wheeze, rhonchi Abdomen: soft/nontender/nondistended/normal bowel sounds. No rebound or guarding.  Ext: no edema Skin: warm, dry Neuro: grossly normal, moves all extremities, PERRLA    Assessment and Plan:  27 y.o. male presenting for annual physical.  Health Maintenance counseling: 1. Anticipatory guidance: Patient counseled regarding regular dental exams -q6 months, eye exams -eye visits before covid and plans to go back- has some baseline issues in left eye,  avoiding smoking and second hand smoke , limiting alcohol to 2 beverages per day- only weekends and 3 on weekend, no illicit drugs.   2. Risk factor reduction:  Advised patient of need for regular exercise and diet rich and fruits and vegetables to reduce risk of heart attack and stroke.  Exercise-typically maintains regular exercise/weight lifting outside of deer season when he is hunting- plans to restart.  Diet/weight management-weight has maintained from last visit-around 225 -227 on home scales still- cutting down on a trail mix he was eating in excess. Plans to use myfitnesspal.  Wt Readings from Last 3 Encounters:  11/29/22  233 lb (105.7 kg)  11/09/21 232 lb (105.2 kg)  12/22/20 219 lb 3.2 oz (99.4 kg)  3. Immunizations/screenings/ancillary studies-declines Prevnar 20 for asthma as well as COVID-19, flu shot today, hepatitis C- update with labs  Immunization History  Administered Date(s) Administered   Hpv-Unspecified 05/06/2011, 07/08/2011, 11/18/2011   Influenza Inj Mdck Quad Pf 09/02/2017   Influenza,inj,Quad PF,6+ Mos 10/17/2018, 08/06/2019, 11/29/2022   Influenza-Unspecified 09/24/2015, 09/21/2016, 07/23/2017, 07/22/2020, 09/22/2021   Janssen (J&J) SARS-COV-2 Vaccination  12/12/2020   MMR 04/06/1995, 08/05/1998   Tdap 05/29/2015  4. Prostate cancer screening-  no family history, start at age 60   5. Colon cancer screening -  no family history, start at age 101  6. Skin cancer screening/prevention-no recent dermatology visits-has seen in the past.- no recent issues but plans to upate. advised regular sunscreen use. Denies worrisome, changing, or new skin lesions.  7. Testicular cancer screening- advised monthly self exams  8. STD screening- patient opts out-monogamous with wife 9. Smoking associated screening- never smoker  Status of chronic or acute concerns   # Asthma # Allergic Rhinitis S:Using Albuterol HFA prn.  Only if has allergic trigger really- very rare Allergies at times can trigger asthma-takes Allegra as needed now A/P: asthma and allergies doing well - continue current medications   # ADD S: off meds since 2022 at least with old job A/P: new job in 2024 and may reach out to restart as has a multitude of tests which may be more challenging  #screening hyperlipidemia Lab Results  Component Value Date   CHOL 149 01/23/2019   HDL 38.10 (L) 01/23/2019   LDLCALC 101 (H) 01/23/2019   TRIG 54.0 01/23/2019   CHOLHDL 4 01/23/2019   Recommended follow up: Return in about 1 year (around 11/30/2023) for physical or sooner if needed.Schedule b4 you leave.  Lab/Order associations: fasting   ICD-10-CM   1. Preventative health care  Z00.00 Hepatitis C antibody    CBC with Differential/Platelet    Comprehensive metabolic panel    Lipid panel    2. Need for immunization against influenza  Z23 Flu Vaccine QUAD 18mo+IM (Fluarix, Fluzone & Alfiuria Quad PF)    3. Encounter for hepatitis C screening test for low risk patient  Z11.59 Lipid panel    4. Screening for hyperlipidemia  Z13.220 Hepatitis C antibody    Comprehensive metabolic panel    5. Screening for deficiency anemia  Z13.0 CBC with Differential/Platelet      No orders of the defined  types were placed in this encounter.   Return precautions advised.   Garret Reddish, MD

## 2022-11-29 NOTE — Patient Instructions (Addendum)
LAB IN ROOM- make sure you feel really good before leaving If you have mychart- we will send your results within 3 business days of [place] receiving them.  If you do not have mychart- we will call you about results within 5 business days of [place] receiving them.  *please also note that you will see labs on mychart as soon as they post. I will later go in and write notes on them- will say "notes from Dr. Yong Channel"   Update your eye exam and dermatology visit  Recommended follow up: Return in about 1 year (around 11/30/2023) for physical or sooner if needed.Schedule b4 you leave.

## 2022-11-30 LAB — HEPATITIS C ANTIBODY: Hepatitis C Ab: NONREACTIVE

## 2022-12-07 ENCOUNTER — Encounter: Payer: Self-pay | Admitting: Family Medicine

## 2022-12-07 ENCOUNTER — Other Ambulatory Visit: Payer: Self-pay

## 2022-12-07 DIAGNOSIS — Z13220 Encounter for screening for lipoid disorders: Secondary | ICD-10-CM

## 2022-12-21 ENCOUNTER — Encounter: Payer: Self-pay | Admitting: Family Medicine

## 2023-04-24 ENCOUNTER — Encounter: Payer: Self-pay | Admitting: Family Medicine

## 2023-04-25 NOTE — Telephone Encounter (Signed)
Eric Gould does not have any availability for 2 weeks. Can he be fit in somewhere?

## 2023-04-25 NOTE — Telephone Encounter (Signed)
See below

## 2023-04-26 NOTE — Telephone Encounter (Signed)
LVM to follow up to see if improving & schedule vv or ov.

## 2023-04-27 ENCOUNTER — Ambulatory Visit: Payer: PRIVATE HEALTH INSURANCE | Admitting: Physician Assistant

## 2023-04-27 ENCOUNTER — Encounter: Payer: Self-pay | Admitting: Physician Assistant

## 2023-04-27 VITALS — BP 130/84 | HR 85 | Temp 98.2°F | Ht 70.5 in | Wt 228.0 lb

## 2023-04-27 DIAGNOSIS — M545 Low back pain, unspecified: Secondary | ICD-10-CM

## 2023-04-27 DIAGNOSIS — G8929 Other chronic pain: Secondary | ICD-10-CM

## 2023-04-27 DIAGNOSIS — J4521 Mild intermittent asthma with (acute) exacerbation: Secondary | ICD-10-CM

## 2023-04-27 MED ORDER — FLUTICASONE-SALMETEROL 45-21 MCG/ACT IN AERO
2.0000 | INHALATION_SPRAY | Freq: Two times a day (BID) | RESPIRATORY_TRACT | 11 refills | Status: DC
Start: 2023-04-27 — End: 2023-06-15

## 2023-04-27 MED ORDER — ALBUTEROL SULFATE HFA 108 (90 BASE) MCG/ACT IN AERS: INHALATION_SPRAY | RESPIRATORY_TRACT | 3 refills | Status: DC

## 2023-04-27 MED ORDER — PREDNISONE 50 MG PO TABS
ORAL_TABLET | ORAL | 0 refills | Status: DC
Start: 2023-04-27 — End: 2023-12-01

## 2023-04-27 NOTE — Progress Notes (Signed)
Subjective:    Patient ID: Eric Gould, male    DOB: 1996-02-13, 27 y.o.   MRN: [id number]  Chief Complaint  Patient presents with   Asthma    Pt in office c/o lingering symptoms of asthma attacks after staying in cabin, pt very sensitive to mold, dust, etc. Pt using inhaler but still having symptoms; also c/o back pain since 2 weeks ago in the gym, took time to rest and had massage due to feeling muscular, impacting movement,  and mobility taking Tylenol daily to help with pain. Wants to also discuss maintenance inhaler   Back Pain    Asthma His past medical history is significant for asthma.  Back Pain   Patient is in today for asthma symptoms and back pain as described in CC note with the CMA above.   Past Medical History:  Diagnosis Date   Asthma     Past Surgical History:  Procedure Laterality Date   CLEFT PALATE REPAIR     CYST EXCISION     left face- benign    Family History  Problem Relation Age of Onset   Asthma Mother    Other Father        passed from hunting accident    Social History   Tobacco Use   Smoking status: Never   Smokeless tobacco: Never  Substance Use Topics   Alcohol use: Yes    Comment: rare social- on Weekend   Drug use: No     No Known Allergies  Review of Systems  Musculoskeletal:  Positive for back pain.   NEGATIVE UNLESS OTHERWISE INDICATED IN HPI      Objective:     BP 130/84 (BP Location: Left Arm)   Pulse 85   Temp 98.2 F (36.8 C) (Temporal)   Ht 5' 10.5" (1.791 m)   Wt 228 lb (103.4 kg)   SpO2 96%   BMI 32.25 kg/m   Wt Readings from Last 3 Encounters:  04/27/23 228 lb (103.4 kg)  11/29/22 233 lb (105.7 kg)  11/09/21 232 lb (105.2 kg)    BP Readings from Last 3 Encounters:  04/27/23 130/84  11/29/22 120/72  11/09/21 138/78     Physical Exam Vitals and nursing note reviewed.  Constitutional:      Appearance: Normal appearance.  HENT:     Mouth/Throat:     Mouth: Mucous membranes are moist.   Eyes:     Extraocular Movements: Extraocular movements intact.     Conjunctiva/sclera: Conjunctivae normal.     Pupils: Pupils are equal, round, and reactive to light.  Cardiovascular:     Rate and Rhythm: Normal rate and regular rhythm.     Pulses: Normal pulses.     Heart sounds: No murmur heard. Pulmonary:     Effort: Pulmonary effort is normal. No respiratory distress.     Breath sounds: Normal breath sounds. No wheezing, rhonchi or rales.  Musculoskeletal:     Thoracic back: Tenderness (right lumbar paraspinal muscles) present. No bony tenderness. Normal range of motion.  Neurological:     Mental Status: He is alert.  Psychiatric:        Mood and Affect: Mood normal.        Assessment & Plan:  Mild intermittent asthma with (acute) exacerbation -     Albuterol Sulfate HFA; INHALE TWO PUFFS BY MOUTH EVERY 6 HOURS AS NEEDED  FOR SHORTNESS OF BREATH  Dispense: 18 g; Refill: 3 -     predniSONE; Take  one tablet po qd x 5 days with food.  Dispense: 5 tablet; Refill: 0 -     Fluticasone-Salmeterol; Inhale 2 puffs into the lungs 2 (two) times daily. Rinse mouth after use.  Dispense: 1 each; Refill: 11  Chronic right-sided low back pain without sciatica -     predniSONE; Take one tablet po qd x 5 days with food.  Dispense: 5 tablet; Refill: 0    Acute on chronic exacerbation of asthma and R lumbar back pain: -No red flags on exam today. -Lungs CTAB today, but still feeling tightness in his chest. -Plan to start on prednisone 50 mg x 5 days, which should help both asthma and back. -Pt aware of risks vs benefits and possible adverse reactions. -Rescue inhaler refilled -May start on Advair after completion of prednisone course to help with asthma maintenance -Ice, rest, lidocaine patches also advised for back    Return if symptoms worsen or fail to improve.   Liliah Dorian M Deiondre Harrower, PA-C

## 2023-05-30 ENCOUNTER — Other Ambulatory Visit: Payer: Self-pay | Admitting: Physician Assistant

## 2023-05-30 DIAGNOSIS — J4521 Mild intermittent asthma with (acute) exacerbation: Secondary | ICD-10-CM

## 2023-05-30 NOTE — Telephone Encounter (Signed)
Pt pharmacy requesting alternative, current Rx not covered by pt insurance

## 2023-06-01 ENCOUNTER — Other Ambulatory Visit: Payer: PRIVATE HEALTH INSURANCE

## 2023-06-01 ENCOUNTER — Other Ambulatory Visit: Payer: Self-pay | Admitting: Physician Assistant

## 2023-06-01 DIAGNOSIS — J4521 Mild intermittent asthma with (acute) exacerbation: Secondary | ICD-10-CM

## 2023-06-14 ENCOUNTER — Encounter: Payer: Self-pay | Admitting: Family Medicine

## 2023-06-15 MED ORDER — FLUTICASONE-SALMETEROL 100-50 MCG/ACT IN AEPB: 1.0000 | INHALATION_SPRAY | Freq: Two times a day (BID) | RESPIRATORY_TRACT | 11 refills | Status: DC

## 2023-12-01 ENCOUNTER — Telehealth: Payer: Self-pay | Admitting: Family Medicine

## 2023-12-01 ENCOUNTER — Ambulatory Visit (INDEPENDENT_AMBULATORY_CARE_PROVIDER_SITE_OTHER): Payer: 59 | Admitting: Family Medicine

## 2023-12-01 ENCOUNTER — Encounter: Payer: Self-pay | Admitting: Family Medicine

## 2023-12-01 VITALS — BP 138/80 | HR 74 | Temp 98.2°F | Ht 70.5 in | Wt 232.0 lb

## 2023-12-01 DIAGNOSIS — Z6832 Body mass index (BMI) 32.0-32.9, adult: Secondary | ICD-10-CM

## 2023-12-01 DIAGNOSIS — Z131 Encounter for screening for diabetes mellitus: Secondary | ICD-10-CM

## 2023-12-01 DIAGNOSIS — Z13220 Encounter for screening for lipoid disorders: Secondary | ICD-10-CM

## 2023-12-01 DIAGNOSIS — Z79899 Other long term (current) drug therapy: Secondary | ICD-10-CM | POA: Diagnosis not present

## 2023-12-01 DIAGNOSIS — Z0000 Encounter for general adult medical examination without abnormal findings: Secondary | ICD-10-CM | POA: Diagnosis not present

## 2023-12-01 DIAGNOSIS — E66811 Obesity, class 1: Secondary | ICD-10-CM

## 2023-12-01 DIAGNOSIS — F988 Other specified behavioral and emotional disorders with onset usually occurring in childhood and adolescence: Secondary | ICD-10-CM

## 2023-12-01 DIAGNOSIS — E6609 Other obesity due to excess calories: Secondary | ICD-10-CM

## 2023-12-01 DIAGNOSIS — Z130 Encounter for screening for diseases of the blood and blood-forming organs and certain disorders involving the immune mechanism: Secondary | ICD-10-CM

## 2023-12-01 MED ORDER — AMPHETAMINE-DEXTROAMPHET ER 20 MG PO CP24: 20.0000 mg | ORAL_CAPSULE | ORAL | 0 refills | Status: DC

## 2023-12-01 NOTE — Patient Instructions (Addendum)
-   blood pressure high acceptable- encouraged him to check at home- omron series 3 is a reasonably priced rough- around $35. Want him at least <135/85 at home with restarting Adderall - this is mainly being followed with Adderall restart (he will reach out in 3 moths for refill)  Labs in room due to fainting If you have mychart- we will send your results within 3 business days of us  receiving them.  If you do not have mychart- we will call you about results within 5 business days of us  receiving them.  *please also note that you will see labs on mychart as soon as they post. I will later go in and write notes on them- will say notes from Dr. Katrinka   Recommended follow up: Return in about 6 months (around 05/30/2024) for followup or sooner if needed.Schedule b4 you leave.

## 2023-12-01 NOTE — Telephone Encounter (Signed)
 FYI: Pt will be needing something regarding his new RX. Advised pt to check with employer as to what they will need exactly. Pt will let us know.

## 2023-12-01 NOTE — Addendum Note (Signed)
 Addended by: Gwenette Greet on: 12/01/2023 02:32 PM   Modules accepted: Orders

## 2023-12-01 NOTE — Progress Notes (Signed)
 Phone: [phone]    Subjective:  Patient presents today for their annual physical. Chief complaint-noted.   See problem oriented charting- ROS- full  review of systems was completed and negative  Per full ROS sheet completed by patient  The following were reviewed and entered/updated in epic: Past Medical History:  Diagnosis Date   Asthma    Patient Active Problem List   Diagnosis Date Noted   Attention deficit disorder (ADD) in adult 11/06/2013    Priority: High   Asthma, mild intermittent 11/06/2013    Priority: Medium    Allergic rhinitis 11/06/2013    Priority: Low   Leg length inequality 02/18/2018   Chronic right-sided low back pain without sciatica 02/01/2018   Past Surgical History:  Procedure Laterality Date   CLEFT PALATE REPAIR     CYST EXCISION     left face- benign    Family History  Problem Relation Age of Onset   Asthma Mother    Other Father        passed from hunting accident    Medications- reviewed and updated Current Outpatient Medications  Medication Sig Dispense Refill   albuterol  (VENTOLIN  HFA) 108 (90 Base) MCG/ACT inhaler INHALE TWO PUFFS BY MOUTH EVERY 6 HOURS AS NEEDED  FOR SHORTNESS OF BREATH 18 g 3   fexofenadine  (ALLEGRA ) 180 MG tablet Take 180 mg by mouth daily.     fluticasone -salmeterol (WIXELA INHUB) 100-50 MCG/ACT AEPB Inhale 1 puff into the lungs 2 (two) times daily. 1 each 11   No current facility-administered medications for this visit.    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 Started dating wife in 2017.    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 Majored in criminal justice   Formerly [employer] [employer]      Hobbies: hunting, fishing, shooting      Objective:  BP 138/80   Pulse 74   Temp 98.2 F (36.8 C)   Ht 5' 10.5 (1.791 m)   Wt 232 lb (105.2 kg)   SpO2 98%   BMI 32.82 kg/m  Gen: NAD, resting comfortably HEENT: Mucous membranes are moist. Oropharynx normal Neck: no thyromegaly CV: RRR no murmurs rubs or gallops Lungs: CTAB no crackles, wheeze, rhonchi Abdomen: soft/nontender/nondistended/normal bowel sounds. No rebound or guarding.  Ext: no edema Skin: warm, dry Neuro: grossly normal, moves all extremities, PERRLA Declines genitourinary and rectal exam    Assessment and Plan:  28 y.o. male presenting for annual physical.  Health Maintenance counseling: 1. Anticipatory guidance: Patient counseled regarding regular dental exams -q6 months, eye exams-last year was planning to schedule ophthalmology follow-up with some baseline issues in his left eye-he reports using contacts,  avoiding smoking and second hand smoke , limiting alcohol to 2 beverages per day-only on weekends and about 3-5 total , no illicit drugs.   2. Risk factor reduction:  Advised patient of need for regular exercise and diet rich and fruits and vegetables to reduce risk of heart attack and stroke.  Exercise-continues exercise outside of deer season(shoots 5 days a week).  Diet/weight management-up  1 pound from last year-still right around 230 on home scales.  Encouraged mild weight loss but does have good muscle mass- got as low as 225 before Christmas- trying to increase vegetables and tweak diet Wt Readings from Last 3 Encounters:  12/01/23 232 lb (105.2 kg)  04/27/23 228 lb (103.4 kg)  11/29/22 233 lb (105.7 kg)  3. Immunizations/screenings/ancillary studies-declines Prevnar 20 for asthma as well as COVID-19.  Did receive flu shot thankfully  Immunization History  Administered Date(s) Administered   Hpv-Unspecified 05/06/2011,  07/08/2011, 11/18/2011   Influenza Inj Mdck Quad Pf 09/02/2017   Influenza,inj,Quad PF,6+ Mos 10/17/2018, 08/06/2019, 11/29/2022   Influenza-Unspecified 09/24/2015, 09/21/2016, 07/23/2017, 07/22/2020, 09/22/2021, 10/07/2023   Janssen (J&J) SARS-COV-2 Vaccination 12/12/2020   MMR 04/06/1995, 08/05/1998   Tdap 05/29/2015, 09/15/2023  4. Prostate cancer screening-  no family history, start at age 51   5. Colon cancer screening - no family history, start at age 53  6. Skin cancer screening/prevention-scheduled soon to have spot on stomach looked at- if possible full skin check would be great. advised regular sunscreen use. Denies worrisome, changing, or new skin lesions other than spot on stomach 7. Testicular cancer screening- advised monthly self exams  8. STD screening- patient opts out-monogamous with wife 9. Smoking associated screening-never smoker  Status of chronic or acute concerns   #social update- new baby in October. New job for him and wife. A lot of change ! 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   # Asthma # Allergic Rhinitis S:Using Albuterol  HFA prn - maybe every 3 months . Wixela daily has been helpful Allergies at times can trigger asthma-takes Allegra  regularly A/P: asthma and allergies overall stable/controlled- continue current medications    # ADD S: Trialed off of meds in 2022.  -new job in 2024 and may reach out to restart- a lot of testing with new job and certifications and takes 2-3 hours a day studying -In the past on with Adderall 20 mg extended release.  Well-controlled on that dose but temporary pause with job change in [place].  - Controlled substance contract signed March 09, 2016 - Regular urine drug screens with [facility] in past- now not doing that - Diagnosis by Dr. Barkley pediatrics A/P: attention deficit disorder   -NCCSRS reviewed -only Rx through us  in past but not recently - urine drug screen today - blood pressure high  acceptable- encouraged him to check at home- omron series 3 is a reasonably priced rough- around $35. Want him at least <135/85 at home with restarting Adderall -he did have 5 cups of cofee this morning- advised to cut back especially when starts Adderall- but does have young baby  # Screening hyperlipidemia-update labs today-not on medication- weight loss may help Lab Results  Component Value Date   CHOL 191 11/29/2022   HDL 43.50 11/29/2022   LDLCALC 131 (H) 11/29/2022   TRIG 83.0 11/29/2022   CHOLHDL 4 11/29/2022    # Screen for diabetes with BMI over 30   Recommended follow up: Return in about 6 months (around 05/30/2024) for followup or sooner if needed.Schedule b4 you leave.  Lab/Order associations:NOT fasting   ICD-10-CM   1. Preventative health care  Z00.00     2. Screening for hyperlipidemia  Z13.220     3. Attention deficit disorder (ADD) in adult  F98.8     4. High risk medication use  Z79.899     5. Screening for deficiency  anemia  Z13.0     6. Screening for diabetes mellitus  Z13.1     7. Class 1 obesity due to excess calories without serious comorbidity with body mass index (BMI) of 32.0 to 32.9 in adult  E66.811    E66.09    Z68.32       No orders of the defined types were placed in this encounter.   Return precautions advised.   Garnette Lukes, MD

## 2023-12-01 NOTE — Telephone Encounter (Signed)
 Noted.

## 2023-12-03 LAB — DRUG MONITORING, PANEL 8 WITH CONFIRMATION, URINE
6 Acetylmorphine: NEGATIVE ng/mL (ref <10)
Alcohol Metabolites: NEGATIVE ng/mL (ref <500)
Amphetamines: NEGATIVE ng/mL (ref <500)
Benzodiazepines: NEGATIVE ng/mL (ref <100)
Buprenorphine, Urine: NEGATIVE ng/mL (ref <5)
Cocaine Metabolite: NEGATIVE ng/mL (ref <150)
Creatinine: 58.9 mg/dL (ref >=20.0)
MDMA: NEGATIVE ng/mL (ref <500)
Marijuana Metabolite: NEGATIVE ng/mL (ref <20)
Opiates: NEGATIVE ng/mL (ref <100)
Oxidant: NEGATIVE ug/mL (ref <200)
Oxycodone: NEGATIVE ng/mL (ref <100)
pH: 7.2 (ref 4.5–9.0)

## 2023-12-03 LAB — DM TEMPLATE

## 2023-12-05 NOTE — Addendum Note (Signed)
 Addended by: Lorn Junes on: 12/05/2023 11:56 AM   Modules accepted: Orders

## 2023-12-05 NOTE — Addendum Note (Signed)
 Addended by: Lorn Junes on: 12/05/2023 11:55 AM   Modules accepted: Orders

## 2023-12-05 NOTE — Addendum Note (Signed)
 Addended by: Lorn Junes on: 12/05/2023 11:57 AM   Modules accepted: Orders

## 2023-12-22 ENCOUNTER — Ambulatory Visit
Admission: RE | Admit: 2023-12-22 | Discharge: 2023-12-22 | Disposition: A | Discharge status: Home / Self Care | Payer: 59 | Source: Ambulatory Visit | Attending: Family Medicine | Admitting: Family Medicine

## 2023-12-22 ENCOUNTER — Telehealth: Payer: Self-pay

## 2023-12-22 VITALS — BP 138/87 | HR 72 | Temp 98.1°F | Resp 20

## 2023-12-22 DIAGNOSIS — J0100 Acute maxillary sinusitis, unspecified: Secondary | ICD-10-CM | POA: Diagnosis not present

## 2023-12-22 DIAGNOSIS — R051 Acute cough: Secondary | ICD-10-CM

## 2023-12-22 LAB — POCT RAPID STREP A (OFFICE): Rapid Strep A Screen: NEGATIVE

## 2023-12-22 MED ORDER — LIDOCAINE VISCOUS HCL 2 % MT SOLN: 10.0000 mL | OROMUCOSAL | 0 refills | Status: DC | PRN

## 2023-12-22 MED ORDER — FLUTICASONE PROPIONATE 50 MCG/ACT NA SUSP
1.0000 | Freq: Two times a day (BID) | NASAL | 2 refills | Status: AC
Start: 2023-12-22 — End: ?

## 2023-12-22 MED ORDER — AMOXICILLIN-POT CLAVULANATE 875-125 MG PO TABS: 1.0000 | ORAL_TABLET | Freq: Two times a day (BID) | ORAL | 0 refills | Status: DC

## 2023-12-22 MED ORDER — PROMETHAZINE-DM 6.25-15 MG/5ML PO SYRP: 5.0000 mL | ORAL_SOLUTION | Freq: Four times a day (QID) | ORAL | 0 refills | Status: DC | PRN

## 2023-12-22 NOTE — ED Triage Notes (Signed)
Pt reports he has had a sore throat x 1.5 weeks.

## 2023-12-22 NOTE — Telephone Encounter (Signed)
Pt called stating pharmacy did not receive the lidocaine medication. I have resent script.

## 2023-12-22 NOTE — ED Provider Notes (Signed)
[facility]    CSN: [id number] Arrival date & time: 12/22/23  1355      History   Chief Complaint Chief Complaint  Patient presents with   Sore Throat    Entered by patient    HPI Eric Gould is a 28 y.o. male.   Patient presenting today with almost 2 weeks of ongoing nasal congestion, sinus pain and pressure, sore swollen feeling throat, cough, hoarseness.  Denies fever, chills, chest pain, abdominal pain, vomiting, diarrhea.  So far trying numerous over-the-counter cold ingestion medication with minimal relief.  States entire family was sick about 2 weeks ago and everyone else is better besides him.  History of asthma and allergies on as needed regimen for both.    Past Medical History:  Diagnosis Date   Asthma     Patient Active Problem List   Diagnosis Date Noted   Leg length inequality 02/18/2018   Chronic right-sided low back pain without sciatica 02/01/2018   Asthma, mild intermittent 11/06/2013   Attention deficit disorder (ADD) in adult 11/06/2013   Allergic rhinitis 11/06/2013    Past Surgical History:  Procedure Laterality Date   CLEFT PALATE REPAIR     CYST EXCISION     left face- benign       Home Medications    Prior to Admission medications   Medication Sig Start Date End Date Taking? Authorizing Provider  amoxicillin-clavulanate (AUGMENTIN) 875-125 MG tablet Take 1 tablet by mouth every 12 (twelve) hours. 12/22/23  Yes Particia Nearing, PA-C  fluticasone [facility]) 50 MCG/ACT nasal spray Place 1 spray into both nostrils 2 (two) times daily. 12/22/23  Yes Particia Nearing, PA-C  Multiple Vitamin (MULTIVITAMIN) capsule Take 1 capsule by mouth daily.   Yes [provider]  promethazine-dextromethorphan (PROMETHAZINE-DM) 6.25-15 MG/5ML syrup Take 5 mLs by mouth 4 (four) times daily as needed. 12/22/23  Yes Particia Nearing, PA-C  albuterol (VENTOLIN HFA) 108 (90 Base) MCG/ACT inhaler INHALE TWO PUFFS BY MOUTH  EVERY 6 HOURS AS NEEDED  FOR SHORTNESS OF BREATH 04/27/23   Allwardt, Alyssa M, PA-C  amphetamine-dextroamphetamine (ADDERALL XR) 20 MG 24 hr capsule Take 1 capsule (20 mg total) by mouth every morning. May fill today 12/01/23   Shelva Majestic, MD  amphetamine-dextroamphetamine (ADDERALL XR) 20 MG 24 hr capsule Take 1 capsule (20 mg total) by mouth every morning. May fill in one month 12/01/23   Shelva Majestic, MD  amphetamine-dextroamphetamine (ADDERALL XR) 20 MG 24 hr capsule Take 1 capsule (20 mg total) by mouth every morning. May fill in 2 months 12/01/23   Shelva Majestic, MD  fexofenadine (ALLEGRA) 180 MG tablet Take 180 mg by mouth daily.    [provider]  fluticasone-salmeterol (WIXELA INHUB) 100-50 MCG/ACT AEPB Inhale 1 puff into the lungs 2 (two) times daily. 06/15/23   Shelva Majestic, MD  lidocaine (XYLOCAINE) 2 % solution Use as directed 10 mLs in the mouth or throat every 3 (three) hours as needed. 12/22/23   Particia Nearing, PA-C    Family History Family History  Problem Relation Age of Onset   Asthma Mother    Other Father        passed from hunting accident    Social History Social History   Tobacco Use   Smoking status: Never   Smokeless tobacco: Never  Substance Use Topics   Alcohol use: Yes    Comment: rare social- on Weekend   Drug use: No  Allergies   Patient has no known allergies.   Review of Systems Review of Systems Per HPI  Physical Exam Triage Vital Signs ED Triage Vitals [12/22/23 1438]  Encounter Vitals Group     BP 138/87     Systolic BP Percentile      Diastolic BP Percentile      Pulse Rate 72     Resp 20     Temp 98.1 F (36.7 C)     Temp Source Oral     SpO2 95 %     Weight      Height      Head Circumference      Peak Flow      Pain Score 10     Pain Loc      Pain Education      Exclude from Growth Chart    No data found.  Updated Vital Signs BP 138/87 (BP Location: Right Arm)   Pulse 72   Temp  98.1 F (36.7 C) (Oral)   Resp 20   SpO2 95%   Visual Acuity Right Eye Distance:   Left Eye Distance:   Bilateral Distance:    Right Eye Near:   Left Eye Near:    Bilateral Near:     Physical Exam Vitals and nursing note reviewed.  Constitutional:      Appearance: He is well-developed.  HENT:     Head: Atraumatic.     Right Ear: External ear normal.     Left Ear: External ear normal.     Nose: Congestion present.     Mouth/Throat:     Pharynx: Posterior oropharyngeal erythema present. No oropharyngeal exudate.  Eyes:     Conjunctiva/sclera: Conjunctivae normal.     Pupils: Pupils are equal, round, and reactive to light.  Cardiovascular:     Rate and Rhythm: Normal rate and regular rhythm.  Pulmonary:     Effort: Pulmonary effort is normal. No respiratory distress.     Breath sounds: No wheezing or rales.  Musculoskeletal:        General: Normal range of motion.     Cervical back: Normal range of motion and neck supple.  Lymphadenopathy:     Cervical: No cervical adenopathy.  Skin:    General: Skin is warm and dry.  Neurological:     Mental Status: He is alert and oriented to person, place, and time.  Psychiatric:        Behavior: Behavior normal.      UC Treatments / Results  Labs (all labs ordered are listed, but only abnormal results are displayed) Labs Reviewed  POCT RAPID STREP A (OFFICE)    EKG   Radiology No results found.  Procedures Procedures (including critical care time)  Medications Ordered in UC Medications - No data to display  Initial Impression / Assessment and Plan / UC Course  I have reviewed the triage vital signs and the nursing notes.  Pertinent labs & imaging results that were available during my care of the patient were reviewed by me and considered in my medical decision making (see chart for details).     Vitals reassuring, rapid strep negative but given duration worsening course of symptoms we will treat with  Augmentin, Flonase, viscous lidocaine, Phenergan DM.  Discussed supportive home care and return precautions.  Final Clinical Impressions(s) / UC Diagnoses   Final diagnoses:  Acute non-recurrent maxillary sinusitis  Acute cough   Discharge Instructions   None    ED  Prescriptions     Medication Sig Dispense Auth. Provider   amoxicillin-clavulanate (AUGMENTIN) 875-125 MG tablet Take 1 tablet by mouth every 12 (twelve) hours. 14 tablet Particia Nearing, [place]   lidocaine (XYLOCAINE) 2 % solution Use as directed 10 mLs in the mouth or throat every 3 (three) hours as needed. 100 mL Particia Nearing, PA-C   fluticasone [facility]) 50 MCG/ACT nasal spray Place 1 spray into both nostrils 2 (two) times daily. 16 g Roosvelt Maser [place], [place]   promethazine-dextromethorphan (PROMETHAZINE-DM) 6.25-15 MG/5ML syrup Take 5 mLs by mouth 4 (four) times daily as needed. 100 mL Particia Nearing, [place]      PDMP not reviewed this encounter.   Particia Nearing, [place] 12/22/23 [phone]

## 2023-12-31 ENCOUNTER — Ambulatory Visit
Admission: EM | Admit: 2023-12-31 | Discharge: 2023-12-31 | Disposition: A | Discharge status: Home / Self Care | Payer: 59 | Attending: Family Medicine | Admitting: Family Medicine

## 2023-12-31 ENCOUNTER — Other Ambulatory Visit: Payer: Self-pay

## 2023-12-31 ENCOUNTER — Encounter: Payer: Self-pay | Admitting: Emergency Medicine

## 2023-12-31 DIAGNOSIS — J3089 Other allergic rhinitis: Secondary | ICD-10-CM | POA: Diagnosis not present

## 2023-12-31 DIAGNOSIS — H6991 Unspecified Eustachian tube disorder, right ear: Secondary | ICD-10-CM | POA: Diagnosis not present

## 2023-12-31 MED ORDER — DEXAMETHASONE SODIUM PHOSPHATE 10 MG/ML IJ SOLN
10.0000 mg | Freq: Once | INTRAMUSCULAR | Status: AC
  Administered 2023-12-31: 10 mg via INTRAMUSCULAR

## 2023-12-31 NOTE — ED Triage Notes (Signed)
 Pt reports right ear pain x2 days.denies any known fevers.

## 2023-12-31 NOTE — ED Provider Notes (Signed)
 [facility]    CSN: [id number] Arrival date & time: 12/31/23  0850      History   Chief Complaint Chief Complaint  Patient presents with   Ear Pain    HPI Eric Gould is a 28 y.o. male.   Patient presenting today with 2-day history of right ear pain, popping, pressure worse with chewing.  Denies fever, chills, drainage from the ear, loss of hearing, headache.  So far not to anything over-the-counter for symptoms.  History of seasonal allergies and asthma not currently on anything for this.    Past Medical History:  Diagnosis Date   Asthma     Patient Active Problem List   Diagnosis Date Noted   Leg length inequality 02/18/2018   Chronic right-sided low back pain without sciatica 02/01/2018   Asthma, mild intermittent 11/06/2013   Attention deficit disorder (ADD) in adult 11/06/2013   Allergic rhinitis 11/06/2013    Past Surgical History:  Procedure Laterality Date   CLEFT PALATE REPAIR     CYST EXCISION     left face- benign       Home Medications    Prior to Admission medications   Medication Sig Start Date End Date Taking? Authorizing Provider  albuterol  (VENTOLIN  HFA) 108 (90 Base) MCG/ACT inhaler INHALE TWO PUFFS BY MOUTH EVERY 6 HOURS AS NEEDED  FOR SHORTNESS OF BREATH 04/27/23   Allwardt, Alyssa M, PA-C  amoxicillin -clavulanate (AUGMENTIN ) 875-125 MG tablet Take 1 tablet by mouth every 12 (twelve) hours. 12/22/23   Stuart Vernell Norris, PA-C  amphetamine -dextroamphetamine (ADDERALL XR) 20 MG 24 hr capsule Take 1 capsule (20 mg total) by mouth every morning. May fill today 12/01/23   Katrinka Garnette KIDD, MD  amphetamine -dextroamphetamine (ADDERALL XR) 20 MG 24 hr capsule Take 1 capsule (20 mg total) by mouth every morning. May fill in one month 12/01/23   Katrinka Garnette KIDD, MD  amphetamine -dextroamphetamine (ADDERALL XR) 20 MG 24 hr capsule Take 1 capsule (20 mg total) by mouth every morning. May fill in 2 months 12/01/23   Katrinka Garnette KIDD, MD   fexofenadine  (ALLEGRA ) 180 MG tablet Take 180 mg by mouth daily.    [provider]  fluticasone  (FLONASE ) 50 MCG/ACT nasal spray Place 1 spray into both nostrils 2 (two) times daily. 12/22/23   Stuart Vernell Norris, PA-C  fluticasone -salmeterol (WIXELA INHUB) 100-50 MCG/ACT AEPB Inhale 1 puff into the lungs 2 (two) times daily. 06/15/23   Katrinka Garnette KIDD, MD  lidocaine  (XYLOCAINE ) 2 % solution Use as directed 10 mLs in the mouth or throat every 3 (three) hours as needed. Patient not taking: Reported on 12/31/2023 12/22/23   Stuart Vernell Norris, PA-C  Multiple Vitamin (MULTIVITAMIN) capsule Take 1 capsule by mouth daily.    [provider]  promethazine -dextromethorphan (PROMETHAZINE -DM) 6.25-15 MG/5ML syrup Take 5 mLs by mouth 4 (four) times daily as needed. 12/22/23   Stuart Vernell Norris, PA-C    Family History Family History  Problem Relation Age of Onset   Asthma Mother    Other Father        passed from hunting accident    Social History Social History   Tobacco Use   Smoking status: Never   Smokeless tobacco: Never  Substance Use Topics   Alcohol use: Yes    Comment: rare social- on Weekend   Drug use: No     Allergies   Patient has no known allergies.   Review of Systems Review of Systems PER HPI  Physical  Exam Triage Vital Signs ED Triage Vitals  Encounter Vitals Group     BP 12/31/23 0947 127/87     Systolic BP Percentile --      Diastolic BP Percentile --      Pulse Rate 12/31/23 0947 65     Resp 12/31/23 0947 20     Temp 12/31/23 0947 98.4 F (36.9 C)     Temp Source 12/31/23 0947 Oral     SpO2 12/31/23 0947 96 %     Weight --      Height --      Head Circumference --      Peak Flow --      Pain Score 12/31/23 0945 8     Pain Loc --      Pain Education --      Exclude from Growth Chart --    No data found.  Updated Vital Signs BP 127/87 (BP Location: Right Arm)   Pulse 65   Temp 98.4 F (36.9 C) (Oral)   Resp 20    SpO2 96%   Visual Acuity Right Eye Distance:   Left Eye Distance:   Bilateral Distance:    Right Eye Near:   Left Eye Near:    Bilateral Near:     Physical Exam Vitals and nursing note reviewed.  Constitutional:      Appearance: He is well-developed.  HENT:     Head: Atraumatic.     Right Ear: External ear normal.     Left Ear: External ear normal.     Ears:     Comments: Bilateral middle ear effusions    Nose: Rhinorrhea present.     Mouth/Throat:     Pharynx: Posterior oropharyngeal erythema present. No oropharyngeal exudate.  Eyes:     Conjunctiva/sclera: Conjunctivae normal.     Pupils: Pupils are equal, round, and reactive to light.  Cardiovascular:     Rate and Rhythm: Normal rate and regular rhythm.  Pulmonary:     Effort: Pulmonary effort is normal. No respiratory distress.     Breath sounds: No wheezing or rales.  Musculoskeletal:        General: Normal range of motion.     Cervical back: Normal range of motion and neck supple.  Lymphadenopathy:     Cervical: No cervical adenopathy.  Skin:    General: Skin is warm and dry.  Neurological:     Mental Status: He is alert and oriented to person, place, and time.  Psychiatric:        Behavior: Behavior normal.      UC Treatments / Results  Labs (all labs ordered are listed, but only abnormal results are displayed) Labs Reviewed - No data to display  EKG   Radiology No results found.  Procedures Procedures (including critical care time)  Medications Ordered in UC Medications  dexamethasone  (DECADRON ) injection 10 mg (10 mg Intramuscular Given 12/31/23 1014)    Initial Impression / Assessment and Plan / UC Course  I have reviewed the triage vital signs and the nursing notes.  Pertinent labs & imaging results that were available during my care of the patient were reviewed by me and considered in my medical decision making (see chart for details).     Suspect eustachian tube dysfunction secondary  to uncontrolled seasonal allergies.  Treat with IM Decadron , Zyrtec, Flonase , Sudafed and supportive home care.  Return for worsening symptoms.  Final Clinical Impressions(s) / UC Diagnoses   Final diagnoses:  Eustachian tube  dysfunction, right  Seasonal allergic rhinitis due to other allergic trigger     Discharge Instructions      In addition to the steroid injection that we have given today, you may use Zyrtec daily, Flonase  twice daily, Sudafed as needed for pressure and congestion.  Ibuprofen and Tylenol as needed for pain.    ED Prescriptions   None    PDMP not reviewed this encounter.   Stuart Vernell Norris, [place] 12/31/23 1027

## 2023-12-31 NOTE — Discharge Instructions (Signed)
 In addition to the steroid injection that we have given today, you may use Zyrtec daily, Flonase  twice daily, Sudafed as needed for pressure and congestion.  Ibuprofen and Tylenol as needed for pain.

## 2024-01-04 ENCOUNTER — Telehealth: Payer: 59 | Admitting: Physician Assistant

## 2024-01-04 ENCOUNTER — Other Ambulatory Visit: Payer: Self-pay | Admitting: Family Medicine

## 2024-01-04 DIAGNOSIS — H6991 Unspecified Eustachian tube disorder, right ear: Secondary | ICD-10-CM

## 2024-01-04 DIAGNOSIS — H6504 Acute serous otitis media, recurrent, right ear: Secondary | ICD-10-CM | POA: Diagnosis not present

## 2024-01-04 MED ORDER — CIPROFLOXACIN-DEXAMETHASONE 0.3-0.1 % OT SUSP: 4.0000 [drp] | Freq: Two times a day (BID) | OTIC | 0 refills | Status: DC

## 2024-01-04 MED ORDER — AZITHROMYCIN 250 MG PO TABS: ORAL_TABLET | ORAL | 0 refills | Status: AC

## 2024-01-04 NOTE — Telephone Encounter (Signed)
Copied from CRM [id number]. Topic: Clinical - Medication Refill >> Jan 04, 2024  8:34 AM Eric Gould wrote: Most Recent Primary Care Visit:  Provider: Shelva Majestic  Department: [facility]  Visit Type: PHYSICAL  Date: 12/01/2023  Medication: amphetamine-dextroamphetamine (ADDERALL XR) 20 MG 24 hr capsule  Has the patient contacted their pharmacy? Yes (Agent: If no, request that the patient contact the pharmacy for the refill. If patient does not wish to contact the pharmacy document the reason why and proceed with request.) (Agent: If yes, when and what did the pharmacy advise?)  Patient has no refills on medication due to it being a narcotic. Patient also wants to know if he needs to request a refill every month for medication. Please contact patient to advise.   Is this the correct pharmacy for this prescription? Yes If no, delete pharmacy and type the correct one.  This is the patient's preferred pharmacy:  [id number] - [place], [place] - [address] [facility] [address] [place] [address] Phone: [phone] Fax: [phone]   Has the prescription been filled recently? Yes  Is the patient out of the medication? Yes  Has the patient been seen for an appointment in the last year OR does the patient have an upcoming appointment? Yes  Can we respond through MyChart? Yes  Agent: Please be advised that Rx refills may take up to 3 business days. We ask that you follow-up with your pharmacy.

## 2024-01-04 NOTE — Telephone Encounter (Signed)
12/01/2023 LOV  12/01/2023 fill date  30/0 refills

## 2024-01-04 NOTE — Progress Notes (Signed)
Virtual Visit Consent   [name], you are scheduled for a virtual visit with a [facility] provider today. Just as with appointments in the office, your consent must be obtained to participate. Your consent will be active for this visit and any virtual visit you may have with one of our providers in the next 365 days. If you have a MyChart account, a copy of this consent can be sent to you electronically.  As this is a virtual visit, video technology does not allow for your provider to perform a traditional examination. This may limit your provider's ability to fully assess your condition. If your provider identifies any concerns that need to be evaluated in person or the need to arrange testing (such as labs, EKG, etc.), we will make arrangements to do so. Although advances in technology are sophisticated, we cannot ensure that it will always work on either your end or our end. If the connection with a video visit is poor, the visit may have to be switched to a telephone visit. With either a video or telephone visit, we are not always able to ensure that we have a secure connection.  By engaging in this virtual visit, you consent to the provision of healthcare and authorize for your insurance to be billed (if applicable) for the services provided during this visit. Depending on your insurance coverage, you may receive a charge related to this service.  I need to obtain your verbal consent now. Are you willing to proceed with your visit today? Eric Gould has provided verbal consent on 01/04/2024 for a virtual visit (video or telephone). Eric Loveless, PA-C  Date: 01/04/2024 9:11 AM   Virtual Visit via Video Note   I, Eric Gould, connected with  Eric Gould  ([id number], 03-28-1996) on 01/04/24 at  8:00 AM EST by a video-enabled telemedicine application and verified that I am speaking with the correct person using two identifiers.  Location: Patient: Virtual Visit Location  Patient: Home Provider: Virtual Visit Location Provider: Home Office   I discussed the limitations of evaluation and management by telemedicine and the availability of in person appointments. The patient expressed understanding and agreed to proceed.    History of Present Illness: Eric Gould is a 28 y.o. who identifies as a male who was assigned male at birth, and is being seen today for recurrent ear pain.  HPI: Otalgia  There is pain in the right ear. This is a recurrent problem. The current episode started 1 to 4 weeks ago (4 weeks). The problem occurs constantly. The problem has been gradually worsening. There has been no fever. The pain is moderate. Associated symptoms include coughing (mild), headaches and hearing loss (muffled). Pertinent negatives include no diarrhea, ear discharge, rhinorrhea, sore throat or vomiting. Associated symptoms comments: Pain radiating to behind ear and into right jaw from ear now. He has tried acetaminophen, antibiotics and NSAIDs for the symptoms. The treatment provided no relief.   Seen at [facility] on 12/22/23 and diagnosed with Sinus infection. Prescribed Augmentin x 7 days, flonase, viscous lidocaine and Promethazine DM. Reports symptoms, including ear pain had improved. Ear pain then returned on Friday 12/30/23 as only symptom to return from recent URI. Went back to UC on 12/31/23 and was diagnosed with ETD. Advised to restart Flonase, add daily antihistamine, and was given IM dexamethasone. Symptoms have not improved and continue to progress.  Mucinex, Flonase, antihistamine.    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 For 7 days, Disp: 7.5 mL, Rfl: 0   albuterol (VENTOLIN HFA) 108 (90 Base) MCG/ACT inhaler, INHALE TWO PUFFS BY MOUTH EVERY 6 HOURS AS NEEDED  FOR SHORTNESS OF BREATH, Disp: 18 g, Rfl: 3   amoxicillin-clavulanate (AUGMENTIN) 875-125 MG tablet, Take 1 tablet by mouth every 12 (twelve) hours., Disp: 14 tablet, Rfl: 0   amphetamine-dextroamphetamine (ADDERALL XR) 20 MG 24 hr capsule, Take 1 capsule (20 mg total) by mouth every morning. May fill today, Disp: 30 capsule, Rfl: 0   amphetamine-dextroamphetamine (ADDERALL XR) 20 MG 24 hr capsule, Take 1 capsule (20 mg total) by mouth every morning. May fill in one month, Disp: 30 capsule, Rfl: 0   amphetamine-dextroamphetamine (ADDERALL XR) 20 MG 24 hr capsule, Take 1 capsule (20 mg total) by mouth every morning. May fill in 2 months, Disp: 30 capsule, Rfl: 0   fexofenadine (ALLEGRA) 180 MG tablet, Take 180 mg by mouth daily., Disp: , Rfl:    fluticasone (FLONASE) 50 MCG/ACT nasal spray, Place 1 spray into both nostrils 2 (two) times daily., Disp: 16 g, Rfl: 2   fluticasone-salmeterol (WIXELA INHUB) 100-50 MCG/ACT AEPB, Inhale 1 puff into the lungs 2 (two) times daily., Disp: 1 each, Rfl: 11   lidocaine (XYLOCAINE) 2 % solution, Use as directed 10 mLs in the mouth or throat every 3 (three) hours as needed. (Patient not taking: Reported on 12/31/2023), Disp: 100 mL, Rfl: 0   Multiple Vitamin (MULTIVITAMIN) capsule, Take 1 capsule by mouth daily., Disp: , Rfl:    promethazine-dextromethorphan (PROMETHAZINE-DM) 6.25-15 MG/5ML syrup, Take 5 mLs by mouth 4 (four) times daily as needed., Disp: 100 mL, Rfl: 0  Observations/Objective: Patient is well-developed, well-nourished in no acute distress.  Resting comfortably at home.   Head is normocephalic, atraumatic.  No labored breathing.  Speech is clear and coherent with logical content.  Patient is alert and oriented at baseline.    Assessment and Plan: 1. Recurrent acute serous otitis media of right ear (Primary) - azithromycin (ZITHROMAX) 250 MG tablet; Take 2 tablets on day 1, then 1 tablet daily on days 2 through 5  Dispense: 6 tablet; Refill: 0 - ciprofloxacin-dexamethasone (CIPRODEX) OTIC suspension; Place 4 drops into the right ear 2 (two) times daily. For 7 days  Dispense: 7.5 mL; Refill: 0  2. ETD (Eustachian tube dysfunction), right  - Worsening symptoms that have not responded to OTC medications.  - Will give Azithromycin and Ciprodex - Continue saline nasal rinses - Continue Flonase (Fluticasone) nasal spray over the counter for possible eustachian tube dysfunction - Steam and humidifier can help - Warm compress to ear - Tylenol and Ibuprofen can be alternated every 4 hours for pain if needed - Stay well hydrated and get plenty of rest.  - Seek in person evaluation if no symptom improvement or if symptoms worsen   Follow Up Instructions: I discussed the assessment and treatment plan with the patient. The patient was  provided an opportunity to ask questions and all were answered. The patient agreed with the plan and demonstrated an understanding of the instructions.  A copy of instructions were sent to the patient via MyChart unless otherwise noted below.    The patient was advised to call back or seek an in-person evaluation if the symptoms worsen or if the condition fails to improve as anticipated.    Eric Loveless, PA-C

## 2024-01-04 NOTE — Patient Instructions (Signed)
Sharlyne Cai, thank you for joining Margaretann Loveless, PA-C for today's virtual visit.  While this provider is not your primary care provider (PCP), if your PCP is located in our provider database this encounter information will be shared with them immediately following your visit.   A [facility] MyChart account gives you access to today's visit and all your visits, tests, and labs performed at [facility] " click here if you don't have a [facility] MyChart account or go to [email or web address]  Consent: (Patient) Sharlyne Cai provided verbal consent for this virtual visit at the beginning of the encounter.  Current Medications:  Current Outpatient Medications:    azithromycin (ZITHROMAX) 250 MG tablet, Take 2 tablets on day 1, then 1 tablet daily on days 2 through 5, Disp: 6 tablet, Rfl: 0   ciprofloxacin-dexamethasone (CIPRODEX) OTIC suspension, Place 4 drops into the right ear 2 (two) times daily. For 7 days, Disp: 7.5 mL, Rfl: 0   albuterol (VENTOLIN HFA) 108 (90 Base) MCG/ACT inhaler, INHALE TWO PUFFS BY MOUTH EVERY 6 HOURS AS NEEDED  FOR SHORTNESS OF BREATH, Disp: 18 g, Rfl: 3   amoxicillin-clavulanate (AUGMENTIN) 875-125 MG tablet, Take 1 tablet by mouth every 12 (twelve) hours., Disp: 14 tablet, Rfl: 0   amphetamine-dextroamphetamine (ADDERALL XR) 20 MG 24 hr capsule, Take 1 capsule (20 mg total) by mouth every morning. May fill today, Disp: 30 capsule, Rfl: 0   amphetamine-dextroamphetamine (ADDERALL XR) 20 MG 24 hr capsule, Take 1 capsule (20 mg total) by mouth every morning. May fill in one month, Disp: 30 capsule, Rfl: 0   amphetamine-dextroamphetamine (ADDERALL XR) 20 MG 24 hr capsule, Take 1 capsule (20 mg total) by mouth every morning. May fill in 2 months, Disp: 30 capsule, Rfl: 0   fexofenadine (ALLEGRA) 180 MG tablet, Take 180 mg by mouth daily., Disp: , Rfl:    fluticasone (FLONASE) 50 MCG/ACT nasal spray, Place 1 spray into both nostrils 2 (two) times  daily., Disp: 16 g, Rfl: 2   fluticasone-salmeterol (WIXELA INHUB) 100-50 MCG/ACT AEPB, Inhale 1 puff into the lungs 2 (two) times daily., Disp: 1 each, Rfl: 11   lidocaine (XYLOCAINE) 2 % solution, Use as directed 10 mLs in the mouth or throat every 3 (three) hours as needed. (Patient not taking: Reported on 12/31/2023), Disp: 100 mL, Rfl: 0   Multiple Vitamin (MULTIVITAMIN) capsule, Take 1 capsule by mouth daily., Disp: , Rfl:    promethazine-dextromethorphan (PROMETHAZINE-DM) 6.25-15 MG/5ML syrup, Take 5 mLs by mouth 4 (four) times daily as needed., Disp: 100 mL, Rfl: 0   Medications ordered in this encounter:  Meds ordered this encounter  Medications   azithromycin (ZITHROMAX) 250 MG tablet    Sig: Take 2 tablets on day 1, then 1 tablet daily on days 2 through 5    Dispense:  6 tablet    Refill:  0    Supervising Provider:   Merrilee Jansky [[id number]]   ciprofloxacin-dexamethasone (CIPRODEX) OTIC suspension    Sig: Place 4 drops into the right ear 2 (two) times daily. For 7 days    Dispense:  7.5 mL    Refill:  0    Supervising Provider:   Merrilee Jansky [[id number]]     *If you need refills on other medications prior to your next appointment, please contact your pharmacy*  Follow-Up: Call back or seek an in-person evaluation if the symptoms worsen or if the condition fails to improve as anticipated.  [facility]  [facility] [phone]  Other Instructions  Otitis Media, Adult  Otitis media occurs when there is inflammation and fluid in the middle ear with signs and symptoms of an acute infection. The middle ear is a part of the ear that contains bones for hearing as well as air that helps send sounds to the brain. When infected fluid builds up in this space, it causes pressure and can lead to an ear infection. The eustachian tube connects the middle ear to the back of the nose (nasopharynx) and normally allows air into the middle ear. If the eustachian tube becomes blocked,  fluid can build up and become infected. What are the causes? This condition is caused by a blockage in the eustachian tube. This can be caused by mucus or by swelling of the tube. Problems that can cause a blockage include: A cold or other upper respiratory infection. Allergies. An irritant, such as tobacco smoke. Enlarged adenoids. The adenoids are areas of soft tissue located high in the back of the throat, behind the nose and the roof of the mouth. They are part of the body's defense system (immune system). A mass in the nasopharynx. Damage to the ear caused by pressure changes (barotrauma). What increases the risk? You are more likely to develop this condition if you: Smoke or are exposed to tobacco smoke. Have an opening in the roof of your mouth (cleft palate). Have gastroesophageal reflux. Have an immune system disorder. What are the signs or symptoms? Symptoms of this condition include: Ear pain. Fever. Decreased hearing. Tiredness (lethargy). Fluid leaking from the ear, if the eardrum is ruptured or has burst. Ringing in the ear. How is this diagnosed?  This condition is diagnosed with a physical exam. During the exam, your health care provider will use an instrument called an otoscope to look in your ear and check for redness, swelling, and fluid. He or she will also ask about your symptoms. Your health care provider may also order tests, such as: A pneumatic otoscopy. This is a test to check the movement of the eardrum. It is done by squeezing a small amount of air into the ear. A tympanogram. This is a test that shows how well the eardrum moves in response to air pressure in the ear canal. It provides a graph for your health care provider to review. How is this treated? This condition can go away on its own within 3-5 days. But if the condition is caused by a bacterial infection and does not go away on its own, or if it keeps coming back, your health care provider  may: Prescribe antibiotic medicine to treat the infection. Prescribe or recommend medicines to control pain. Follow these instructions at home: Take over-the-counter and prescription medicines only as told by your health care provider. If you were prescribed an antibiotic medicine, take it as told by your health care provider. Do not stop taking the antibiotic even if you start to feel better. Keep all follow-up visits. This is important. Contact a health care provider if: You have bleeding from your nose. There is a lump on your neck. You are not feeling better in 5 days. You feel worse instead of better. Get help right away if: You have severe pain that is not controlled with medicine. You have swelling, redness, or pain around your ear. You have stiffness in your neck. A part of your face is not moving (paralyzed). The bone behind your ear (mastoid bone) is tender when you touch it.  You develop a severe headache. Summary Otitis media is redness, soreness, and swelling of the middle ear, usually resulting in pain and decreased hearing. This condition can go away on its own within 3-5 days. If the problem does not go away in 3-5 days, your health care provider may give you medicines to treat the infection. If you were prescribed an antibiotic medicine, take it as told by your health care provider. Follow all instructions that were given to you by your health care provider. This information is not intended to replace advice given to you by your health care provider. Make sure you discuss any questions you have with your health care provider. Document Revised: 02/16/2021 Document Reviewed: 02/16/2021 Elsevier Patient Education  2024 Elsevier Inc.  Eustachian Tube Dysfunction  Eustachian tube dysfunction refers to a condition in which a blockage develops in the narrow passage that connects the middle ear to the back of the nose (eustachian tube). The eustachian tube regulates air  pressure in the middle ear by letting air move between the ear and nose. It also helps to drain fluid from the middle ear space. Eustachian tube dysfunction can affect one or both ears. When the eustachian tube does not function properly, air pressure, fluid, or both can build up in the middle ear. What are the causes? This condition occurs when the eustachian tube becomes blocked or cannot open normally. Common causes of this condition include: Ear infections. Colds and other infections that affect the nose, mouth, and throat (upper respiratory tract). Allergies. Irritation from cigarette smoke. Irritation from stomach acid coming up into the esophagus (gastroesophageal reflux). The esophagus is the part of the body that moves food from the mouth to the stomach. Sudden changes in air pressure, such as from descending in an airplane or scuba diving. Abnormal growths in the nose or throat, such as: Growths that line the nose (nasal polyps). Abnormal growth of cells (tumors). Enlarged tissue at the back of the throat (adenoids). What increases the risk? You are more likely to develop this condition if: You smoke. You are overweight. You are a child who has: Certain birth defects of the mouth, such as cleft palate. Large tonsils or adenoids. What are the signs or symptoms? Common symptoms of this condition include: A feeling of fullness in the ear. Ear pain. Clicking or popping noises in the ear. Ringing in the ear (tinnitus). Hearing loss. Loss of balance. Dizziness. Symptoms may get worse when the air pressure around you changes, such as when you travel to an area of high elevation, fly on an airplane, or go scuba diving. How is this diagnosed? This condition may be diagnosed based on: Your symptoms. A physical exam of your ears, nose, and throat. Tests, such as those that measure: The movement of your eardrum. Your hearing (audiometry). How is this treated? Treatment depends on  the cause and severity of your condition. In mild cases, you may relieve your symptoms by moving air into your ears. This is called "popping the ears." In more severe cases, or if you have symptoms of fluid in your ears, treatment may include: Medicines to relieve congestion (decongestants). Medicines that treat allergies (antihistamines). Nasal sprays or ear drops that contain medicines that reduce swelling (steroids). A procedure to drain the fluid in your eardrum. In this procedure, a small tube may be placed in the eardrum to: Drain the fluid. Restore the air in the middle ear space. A procedure to insert a balloon device through the nose to  inflate the opening of the eustachian tube (balloon dilation). Follow these instructions at home: Lifestyle Do not do any of the following until your health care provider approves: Travel to high altitudes. Fly in airplanes. Work in a Estate agent or room. Scuba dive. Do not use any products that contain nicotine or tobacco. These products include cigarettes, chewing tobacco, and vaping devices, such as e-cigarettes. If you need help quitting, ask your health care provider. Keep your ears dry. Wear fitted earplugs during showering and bathing. Dry your ears completely after. General instructions Take over-the-counter and prescription medicines only as told by your health care provider. Use techniques to help pop your ears as recommended by your health care provider. These may include: Chewing gum. Yawning. Frequent, forceful swallowing. Closing your mouth, holding your nose closed, and gently blowing as if you are trying to blow air out of your nose. Keep all follow-up visits. This is important. Contact a health care provider if: Your symptoms do not go away after treatment. Your symptoms come back after treatment. You are unable to pop your ears. You have: A fever. Pain in your ear. Pain in your head or neck. Fluid draining from your  ear. Your hearing suddenly changes. You become very dizzy. You lose your balance. Get help right away if: You have a sudden, severe increase in any of your symptoms. Summary Eustachian tube dysfunction refers to a condition in which a blockage develops in the eustachian tube. It can be caused by ear infections, allergies, inhaled irritants, or abnormal growths in the nose or throat. Symptoms may include ear pain or fullness, hearing loss, or ringing in the ears. Mild cases are treated with techniques to unblock the ears, such as yawning or chewing gum. More severe cases are treated with medicines or procedures. This information is not intended to replace advice given to you by your health care provider. Make sure you discuss any questions you have with your health care provider. Document Revised: 01/19/2021 Document Reviewed: 01/19/2021 Elsevier Patient Education  2024 Elsevier Inc.   If you have been instructed to have an in-person evaluation today at a local Urgent Care facility, please use the link below. It will take you to a list of all of our available [facility], including address, phone number and hours of operation. Please do not delay care.  [facility]  If you or a family member do not have a primary care provider, use the link below to schedule a visit and establish care. When you choose a [facility] primary care physician or advanced practice provider, you gain a long-term partner in health. Find a Primary Care Provider  Learn more about [facility]'s in-office and virtual care options:  - Get Care Now

## 2024-01-05 MED ORDER — AMPHETAMINE-DEXTROAMPHET ER 20 MG PO CP24: 20.0000 mg | ORAL_CAPSULE | ORAL | 0 refills | Status: DC

## 2024-01-09 ENCOUNTER — Other Ambulatory Visit (INDEPENDENT_AMBULATORY_CARE_PROVIDER_SITE_OTHER): Payer: 59

## 2024-01-09 ENCOUNTER — Encounter: Payer: Self-pay | Admitting: Family Medicine

## 2024-01-09 DIAGNOSIS — Z13220 Encounter for screening for lipoid disorders: Secondary | ICD-10-CM

## 2024-01-09 DIAGNOSIS — E6609 Other obesity due to excess calories: Secondary | ICD-10-CM

## 2024-01-09 DIAGNOSIS — Z131 Encounter for screening for diabetes mellitus: Secondary | ICD-10-CM | POA: Diagnosis not present

## 2024-01-09 DIAGNOSIS — E66811 Obesity, class 1: Secondary | ICD-10-CM

## 2024-01-09 DIAGNOSIS — Z6832 Body mass index (BMI) 32.0-32.9, adult: Secondary | ICD-10-CM | POA: Diagnosis not present

## 2024-01-09 DIAGNOSIS — Z130 Encounter for screening for diseases of the blood and blood-forming organs and certain disorders involving the immune mechanism: Secondary | ICD-10-CM

## 2024-01-09 DIAGNOSIS — Z0000 Encounter for general adult medical examination without abnormal findings: Secondary | ICD-10-CM | POA: Diagnosis not present

## 2024-01-09 DIAGNOSIS — Z79899 Other long term (current) drug therapy: Secondary | ICD-10-CM | POA: Diagnosis not present

## 2024-01-09 LAB — LIPID PANEL
Cholesterol: 140 mg/dL (ref 0–200)
HDL: 30.3 mg/dL — ABNORMAL LOW (ref >39.00)
LDL Cholesterol: 96 mg/dL (ref 0–99)
NonHDL: 109.93
Total CHOL/HDL Ratio: 5
Triglycerides: 72 mg/dL (ref 0.0–149.0)
VLDL: 14.4 mg/dL (ref 0.0–40.0)

## 2024-01-09 LAB — CBC WITH DIFFERENTIAL/PLATELET
Basophils Absolute: 0 10*3/uL (ref 0.0–0.1)
Basophils Relative: 0.7 % (ref 0.0–3.0)
Eosinophils Absolute: 0 10*3/uL (ref 0.0–0.7)
Eosinophils Relative: 0.6 % (ref 0.0–5.0)
HCT: 45.2 % (ref 39.0–52.0)
Hemoglobin: 15.5 g/dL (ref 13.0–17.0)
Lymphocytes Relative: 18.9 % (ref 12.0–46.0)
Lymphs Abs: 1 10*3/uL (ref 0.7–4.0)
MCHC: 34.2 g/dL (ref 30.0–36.0)
MCV: 87.9 fL (ref 78.0–100.0)
Monocytes Absolute: 0.8 10*3/uL (ref 0.1–1.0)
Monocytes Relative: 14.9 % — ABNORMAL HIGH (ref 3.0–12.0)
Neutro Abs: 3.3 10*3/uL (ref 1.4–7.7)
Neutrophils Relative %: 64.9 % (ref 43.0–77.0)
Platelets: 293 10*3/uL (ref 150.0–400.0)
RBC: 5.14 Mil/uL (ref 4.22–5.81)
RDW: 12.7 % (ref 11.5–15.5)
WBC: 5.2 10*3/uL (ref 4.0–10.5)

## 2024-01-09 LAB — HEMOGLOBIN A1C: Hgb A1c MFr Bld: 5.5 % (ref 4.6–6.5)

## 2024-02-03 ENCOUNTER — Other Ambulatory Visit: Payer: Self-pay | Admitting: Family Medicine

## 2024-02-03 MED ORDER — AMPHETAMINE-DEXTROAMPHET ER 20 MG PO CP24: 20.0000 mg | ORAL_CAPSULE | ORAL | 0 refills | Status: DC

## 2024-02-03 NOTE — Telephone Encounter (Signed)
 Copied from CRM [id number]. Topic: Clinical - Medication Refill >> Feb 03, 2024 11:14 AM Eric Gould wrote: Most Recent Primary Care Visit:  Provider: [facility]  Department: [facility]  Visit Type: LAB VISIT  Date: 01/09/2024  Medication: amphetamine-dextroamphetamine (ADDERALL XR) 20 MG 24 hr capsule  Has the patient contacted their pharmacy? No, controlled substance  (Agent: If no, request that the patient contact the pharmacy for the refill. If patient does not wish to contact the pharmacy document the reason why and proceed with request.) (Agent: If yes, when and what did the pharmacy advise?)  Is this the correct pharmacy for this prescription? Yes If no, delete pharmacy and type the correct one.  This is the patient's preferred pharmacy:  [id number] - [place], [place] - [address] AT [facility] [address] [place]  [address] Phone: [phone] Fax: [phone]   Has the prescription been filled recently? Yes  Is the patient out of the medication? No 2 left  Has the patient been seen for an appointment in the last year OR does the patient have an upcoming appointment? Yes  Can we respond through MyChart? Yes  Agent: Please be advised that Rx refills may take up to 3 business days. We ask that you follow-up with your pharmacy.

## 2024-03-06 ENCOUNTER — Other Ambulatory Visit: Payer: Self-pay | Admitting: Family Medicine

## 2024-03-06 NOTE — Telephone Encounter (Signed)
 Copied from CRM [id number]. Topic: Clinical - Medication Refill >> Mar 06, 2024 12:15 PM Allyne Areola wrote: Most Recent Primary Care Visit:  Provider: [facility]  Department: [facility]  Visit Type: LAB VISIT  Date: 01/09/2024  Medication: amphetamine-dextroamphetamine (ADDERALL XR) 20 MG 24 hr capsule  Has the patient contacted their pharmacy? No (Agent: If no, request that the patient contact the pharmacy for the refill. If patient does not wish to contact the pharmacy document the reason why and proceed with request.) (Agent: If yes, when and what did the pharmacy advise?)  Is this the correct pharmacy for this prescription? Yes If no, delete pharmacy and type the correct one.  This is the patient's preferred pharmacy:  [id number] - [place], [place] - [address] AT [facility] [address] [place] [address] Phone: [phone] Fax: [phone]   Has the prescription been filled recently? No  Is the patient out of the medication? Yes, he would like for it to be expedited if possible.    Has the patient been seen for an appointment in the last year OR does the patient have an upcoming appointment? Yes  Can we respond through MyChart? Yes  Agent: Please be advised that Rx refills may take up to 3 business days. We ask that you follow-up with your pharmacy.

## 2024-03-07 NOTE — Telephone Encounter (Signed)
 12/01/2023 LOV  3/14/20225 fill date  30/0 refills

## 2024-03-07 NOTE — Telephone Encounter (Signed)
 Sent pt myChart message informing pt to check with his pharmacy.

## 2024-03-15 NOTE — Telephone Encounter (Signed)
 I called and spoke with pharmacy- patient filled this on the 17th- I think this is a case where calling and trying to connect with patient and or pharmacy and documenting would be very helpful- we are producing a lot of duplicate work- in addition for attention deficit disorder medications- we need to give feedback to E2C2 to look for forward dated prescriptions

## 2024-03-19 ENCOUNTER — Other Ambulatory Visit: Payer: Self-pay | Admitting: Family Medicine

## 2024-04-10 ENCOUNTER — Other Ambulatory Visit: Payer: Self-pay

## 2024-04-10 DIAGNOSIS — J4521 Mild intermittent asthma with (acute) exacerbation: Secondary | ICD-10-CM

## 2024-04-10 MED ORDER — ALBUTEROL SULFATE HFA 108 (90 BASE) MCG/ACT IN AERS: INHALATION_SPRAY | RESPIRATORY_TRACT | 3 refills | Status: DC

## 2024-04-10 NOTE — Telephone Encounter (Signed)
 Refill sent to pharmacy.  Copied from CRM [id number]. Topic: Clinical - Medication Refill >> Apr 10, 2024 11:17 AM Dorisann Garre T wrote: Medication: albuterol  (VENTOLIN  HFA) 108 (90 Base) MCG/ACT inhaler emergency inhaler   Has the patient contacted their pharmacy? Yes (Agent: If no, request that the patient contact the pharmacy for the refill. If patient does not wish to contact the pharmacy document the reason why and proceed with request.) (Agent: If yes, when and what did the pharmacy advise?)  This is the patient's preferred pharmacy:  [id number] - [place], [place] - [address] AT [facility] [address] [place] [address] Phone: [phone] Fax: [phone]  Is this the correct pharmacy for this prescription? Yes If no, delete pharmacy and type the correct one.   Has the prescription been filled recently? No  Is the patient out of the medication? Yes  Has the patient been seen for an appointment in the last year OR does the patient have an upcoming appointment? Yes  Can we respond through MyChart? Yes  Agent: Please be advised that Rx refills may take up to 3 business days. We ask that you follow-up with your pharmacy.

## 2024-05-09 NOTE — Telephone Encounter (Signed)
 Copied from CRM [id number]. Topic: Clinical - Medication Refill >> May 09, 2024  8:53 AM Eric Gould wrote: Medication: amphetamine -dextroamphetamine (ADDERALL XR) 20 MG 24 hr capsule  Has the patient contacted their pharmacy? Yes (Agent: If no, request that the patient contact the pharmacy for the refill. If patient does not wish to contact the pharmacy document the reason why and proceed with request.) (Agent: If yes, when and what did the pharmacy advise?)  This is the patient's preferred pharmacy:  [id number] - [place], [place] - [address] AT [facility] [address] [place] [address] Phone: [phone] Fax: [phone]  Is this the correct pharmacy for this prescription? Yes If no, delete pharmacy and type the correct one.   Has the prescription been filled recently? No  Is the patient out of the medication? Yes  Has the patient been seen for an appointment in the last year OR does the patient have an upcoming appointment? Yes  Can we respond through MyChart? No  Agent: Please be advised that Rx refills may take up to 3 business days. We ask that you follow-up with your pharmacy.

## 2024-05-10 MED ORDER — AMPHETAMINE-DEXTROAMPHET ER 20 MG PO CP24: 20.0000 mg | ORAL_CAPSULE | ORAL | 0 refills | Status: DC

## 2024-05-10 NOTE — Addendum Note (Signed)
 Addended by: Arva Lathe on: 05/10/2024 09:47 AM   Modules accepted: Orders

## 2024-05-31 ENCOUNTER — Ambulatory Visit: Payer: 59 | Admitting: Family Medicine

## 2024-06-11 ENCOUNTER — Other Ambulatory Visit: Payer: Self-pay | Admitting: Family Medicine

## 2024-06-11 NOTE — Telephone Encounter (Signed)
 FYI Only or Action Required?: Action required by provider: medication refill request.  Patient was last seen in primary care on 12/01/2023 by Katrinka Garnette KIDD, MD.  Called Nurse Triage reporting No chief complaint on file..  Symptoms began today.  Interventions attempted: Nothing.  Symptoms are: stable.  Triage Disposition: No disposition on file.  Patient/caregiver understands and will follow disposition?:

## 2024-06-11 NOTE — Telephone Encounter (Unsigned)
 Copied from CRM [id number]. Topic: Clinical - Medication Refill >> Jun 11, 2024 11:38 AM Thersia C wrote: Medication: amphetamine -dextroamphetamine (ADDERALL XR) 20 MG 24 hr capsule - is completely out   Has the patient contacted their pharmacy? Yes (Agent: If no, request that the patient contact the pharmacy for the refill. If patient does not wish to contact the pharmacy document the reason why and proceed with request.) (Agent: If yes, when and what did the pharmacy advise?)  This is the patient's preferred pharmacy:  [id number] - [place] - [address] AT [facility] [address] [place] [address] Phone: [phone] Fax: [phone]  Is this the correct pharmacy for this prescription? Yes If no, delete pharmacy and type the correct one.   Has the prescription been filled recently? No  Is the patient out of the medication? Yes  Has the patient been seen for an appointment in the last year OR does the patient have an upcoming appointment? Yes  Can we respond through MyChart? Yes  Agent: Please be advised that Rx refills may take up to 3 business days. We ask that you follow-up with your pharmacy.

## 2024-07-09 ENCOUNTER — Ambulatory Visit: Payer: Self-pay | Admitting: Family Medicine

## 2024-07-09 ENCOUNTER — Encounter: Payer: Self-pay | Admitting: Family Medicine

## 2024-07-09 VITALS — BP 138/86 | HR 72 | Temp 98.0°F | Ht 70.5 in | Wt 235.6 lb

## 2024-07-09 DIAGNOSIS — J3089 Other allergic rhinitis: Secondary | ICD-10-CM

## 2024-07-09 DIAGNOSIS — F988 Other specified behavioral and emotional disorders with onset usually occurring in childhood and adolescence: Secondary | ICD-10-CM | POA: Diagnosis not present

## 2024-07-09 DIAGNOSIS — J4530 Mild persistent asthma, uncomplicated: Secondary | ICD-10-CM

## 2024-07-09 DIAGNOSIS — R030 Elevated blood-pressure reading, without diagnosis of hypertension: Secondary | ICD-10-CM | POA: Diagnosis not present

## 2024-07-09 MED ORDER — AMPHETAMINE-DEXTROAMPHET ER 20 MG PO CP24: 20.0000 mg | ORAL_CAPSULE | ORAL | 0 refills | Status: DC

## 2024-07-09 NOTE — Progress Notes (Signed)
 Phone [phone] In person visit   Subjective:   Eric Gould is a 28 y.o. 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     fluticasone  (FLONASE ) 50 MCG/ACT nasal spray Place 1 spray into both nostrils 2 (two) times daily. 16 g 2   fluticasone -salmeterol (WIXELA INHUB) 100-50 MCG/ACT AEPB Inhale 1 puff into the lungs 2 (two) times daily. 1 each 11   Multiple Vitamin (MULTIVITAMIN) capsule Take 1 capsule by mouth daily.     amphetamine -dextroamphetamine (ADDERALL XR) 20 MG 24 hr capsule Take 1 capsule (20 mg total) by mouth every morning. May fill in 2 months 30 capsule 0   amphetamine -dextroamphetamine (ADDERALL XR) 20 MG 24 hr capsule Take 1 capsule (20 mg total) by mouth every morning. May fill today 30 capsule 0   amphetamine -dextroamphetamine (ADDERALL XR) 20 MG 24 hr capsule Take 1 capsule (20 mg total) by mouth every morning. May fill in one month 30 capsule 0   No current facility-administered medications for this visit.     Objective:  BP 138/86   Pulse 72   Temp 98 F (36.7 C)   Ht 5' 10.5 (1.791 m)   Wt 235 lb 9.6 oz (106.9 kg)   BMI 33.33 kg/m  Gen: NAD, resting comfortably Nasal turbinates mildly erythematous and  edematous with clear discharge.  Pharynx with mild drainage/posterior erythema without tonsillar exudate or edema.  Tympanic membranes normal bilaterally CV: RRR no murmurs rubs or gallops Lungs: CTAB no crackles, wheeze, rhonchi Ext: no edema Skin: warm, dry    Assessment and Plan   #Viral upper respiratory infection (URI)  S: Son has been sick- patient has slightly runny nose and some sinus pressure- clear - starting Saturday. Flonase  only.  A/P: Viral URI in early stages-has not flared his asthma and he wants to continue conservative care -Declines COVID-19 testing  #elevated blood pressure reading  S: medication: none Home readings #s: no cuff -still exercising weights and 20 minutes of cardiology 5 days a week - no caffeine but had dense bean salad with some processed meats and salt -reports weight stable 230 at home- wants to get to 225 -has reduced caffeine BP Readings from Last 3 Encounters:  07/09/24 138/86  12/31/23 127/87  12/22/23 138/87  A/P: blood pressure last 2 visits high acceptable systolic- to be on cautious side with ongoing attention deficit disorder medications he is going to monitor at home at least a few days the week before next visit- can do some spot checks otherwise as well  # Asthma # Allergic Rhinitis S:Using Albuterol  HFA as needed after restarting wixela 100-50 mcg 1 puff twice daily - using consistently.  - Allergies at times can trigger  asthma-takes Allegra  as needed - not lately - lifted today but didn't do cardiology- has not noted chest pain or shortness of breath. Did push mow yesterday and was hot and felt like maybe more winded but no wheezing- didn't even need albuterol  A/P: asthma well controlled continue current medications - thankful no flare with recent illness allergies - reasonable control mainly with just Flonase  lately  # ADD S: medication(s):  Adderall 20 mg extended release.  Control: reasonable control- moved from 4 am to 7 30  am to be closer to studying time in the evening- definitely helps at work  - Controlled substance contract signed March 09, 2016 - UDS 12/01/23 - Diagnosis by Dr. Barkley pediatrics A/P: well controlled continue current medications   -NCCSRS reviewed today  -only Rx through us   Recommended follow up: Return in about 6 months (around 01/09/2025) for physical or sooner if needed.Schedule b4 you leave. Future Appointments  Date Time Provider Department Center  01/07/2025  8:00 AM Katrinka Garnette KIDD, MD [facility]    Lab/Order associations:   ICD-10-CM   1. Attention deficit disorder (ADD) in adult  F98.8     2. Mild persistent asthma without complication  J45.30     3. Non-seasonal allergic rhinitis, unspecified trigger  J30.89     4. Elevated blood pressure reading  R03.0       Meds ordered this encounter  Medications   amphetamine -dextroamphetamine (ADDERALL XR) 20 MG 24 hr capsule    Sig: Take 1 capsule (20 mg total) by mouth every morning. May fill in 2 months    Dispense:  30 capsule    Refill:  0   amphetamine -dextroamphetamine (ADDERALL XR) 20 MG 24 hr capsule    Sig: Take 1 capsule (20 mg total) by mouth every morning. May fill today    Dispense:  30 capsule    Refill:  0   amphetamine -dextroamphetamine (ADDERALL XR) 20 MG 24 hr capsule    Sig: Take 1 capsule (20 mg total) by mouth every morning. May fill in one month    Dispense:  30 capsule    Refill:  0    Return precautions advised.  Garnette Katrinka, MD

## 2024-07-09 NOTE — Patient Instructions (Addendum)
 blood pressure last 2 visits high acceptable systolic- to be on cautious side with ongoing attention deficit disorder medications he is going to monitor at home at least a few days the week before next visit- can do some spot checks otherwise as well. Ideally at home blood pressure <135/85. Under 130/80 even better  Refilled medications  Enjoy that beautiful baby!   Recommended follow up: Return in about 6 months (around 01/09/2025) for physical or sooner if needed.Schedule b4 you leave.

## 2024-08-31 ENCOUNTER — Other Ambulatory Visit: Payer: Self-pay

## 2024-08-31 ENCOUNTER — Encounter: Payer: Self-pay | Admitting: Family Medicine

## 2024-08-31 DIAGNOSIS — J4521 Mild intermittent asthma with (acute) exacerbation: Secondary | ICD-10-CM

## 2024-08-31 MED ORDER — ALBUTEROL SULFATE HFA 108 (90 BASE) MCG/ACT IN AERS: INHALATION_SPRAY | RESPIRATORY_TRACT | 3 refills | Status: AC

## 2024-09-04 ENCOUNTER — Other Ambulatory Visit: Payer: Self-pay

## 2024-09-04 MED ORDER — FLUTICASONE-SALMETEROL 100-50 MCG/ACT IN AEPB: 1.0000 | INHALATION_SPRAY | Freq: Two times a day (BID) | RESPIRATORY_TRACT | 11 refills | Status: AC

## 2024-09-17 ENCOUNTER — Other Ambulatory Visit: Payer: Self-pay | Admitting: Family Medicine

## 2024-09-17 MED ORDER — AMPHETAMINE-DEXTROAMPHET ER 20 MG PO CP24: 20.0000 mg | ORAL_CAPSULE | ORAL | 0 refills | Status: AC

## 2024-09-17 MED ORDER — AMPHETAMINE-DEXTROAMPHET ER 20 MG PO CP24: 20.0000 mg | ORAL_CAPSULE | ORAL | 0 refills | Status: DC

## 2024-09-17 NOTE — Telephone Encounter (Signed)
 Copied from CRM [id number]. Topic: Clinical - Medication Refill >> Sep 17, 2024  9:53 AM Aleatha C wrote: Medication: amphetamine -dextroamphetamine (ADDERALL XR) 20 MG 24 hr capsule  Has the patient contacted their pharmacy? Yes (Agent: If no, request that the patient contact the pharmacy for the refill. If patient does not wish to contact the pharmacy document the reason why and proceed with request.) (Agent: If yes, when and what did the pharmacy advise?)  This is the patient's preferred pharmacy:  [facility] [address] [place], [place] [address] [phone]  Is this the correct pharmacy for this prescription? Yes If no, delete pharmacy and type the correct one.   Has the prescription been filled recently? No  Is the patient out of the medication? Yes  Has the patient been seen for an appointment in the last year OR does the patient have an upcoming appointment? Yes  Can we respond through MyChart? No  Agent: Please be advised that Rx refills may take up to 3 business days. We ask that you follow-up with your pharmacy.

## 2024-10-10 ENCOUNTER — Telehealth: Admitting: Physician Assistant

## 2024-10-10 DIAGNOSIS — H6991 Unspecified Eustachian tube disorder, right ear: Secondary | ICD-10-CM

## 2024-10-11 MED ORDER — IPRATROPIUM BROMIDE 0.03 % NA SOLN: 2.0000 | Freq: Two times a day (BID) | NASAL | 0 refills | Status: AC

## 2024-10-11 NOTE — Progress Notes (Signed)

## 2024-12-25 ENCOUNTER — Other Ambulatory Visit: Payer: Self-pay

## 2024-12-25 MED ORDER — AMPHETAMINE-DEXTROAMPHET ER 20 MG PO CP24: 20.0000 mg | ORAL_CAPSULE | ORAL | 0 refills | Status: AC

## 2025-01-07 ENCOUNTER — Encounter: Payer: 59 | Admitting: Family Medicine
